# Patient Record
Sex: Female | Born: 1960
Health system: Southern US, Community
[De-identification: ages and names within clinical notes are randomized; demographics above are authoritative.]

## PROBLEM LIST (undated history)

## (undated) DIAGNOSIS — N951 Menopausal and female climacteric states: Secondary | ICD-10-CM

## (undated) HISTORY — PX: TUBAL LIGATION: SHX77

## (undated) HISTORY — PX: ADENOIDECTOMY: SUR15

## (undated) HISTORY — DX: Menopausal and female climacteric states: N95.1

---

## 2001-03-18 ENCOUNTER — Other Ambulatory Visit: Admission: RE | Admit: 2001-03-18 | Discharge: 2001-03-18 | Payer: Self-pay | Admitting: Obstetrics and Gynecology

## 2002-06-03 ENCOUNTER — Other Ambulatory Visit: Admission: RE | Admit: 2002-06-03 | Discharge: 2002-06-03 | Payer: Self-pay | Admitting: Obstetrics and Gynecology

## 2003-06-08 ENCOUNTER — Other Ambulatory Visit: Admission: RE | Admit: 2003-06-08 | Discharge: 2003-06-08 | Payer: Self-pay | Admitting: Obstetrics and Gynecology

## 2004-06-09 ENCOUNTER — Other Ambulatory Visit: Admission: RE | Admit: 2004-06-09 | Discharge: 2004-06-09 | Payer: Self-pay | Admitting: Obstetrics and Gynecology

## 2005-04-24 ENCOUNTER — Ambulatory Visit: Payer: Self-pay | Admitting: Cardiology

## 2005-05-14 ENCOUNTER — Encounter: Payer: Self-pay | Admitting: Family Medicine

## 2005-05-18 ENCOUNTER — Ambulatory Visit: Payer: Self-pay | Admitting: Family Medicine

## 2005-05-31 ENCOUNTER — Ambulatory Visit: Payer: Self-pay | Admitting: Family Medicine

## 2005-06-12 ENCOUNTER — Other Ambulatory Visit: Admission: RE | Admit: 2005-06-12 | Discharge: 2005-06-12 | Payer: Self-pay | Admitting: Obstetrics and Gynecology

## 2006-01-28 ENCOUNTER — Ambulatory Visit: Payer: Self-pay | Admitting: Family Medicine

## 2006-02-28 ENCOUNTER — Ambulatory Visit: Payer: Self-pay | Admitting: Family Medicine

## 2006-04-26 ENCOUNTER — Ambulatory Visit: Payer: Self-pay | Admitting: Family Medicine

## 2006-05-21 DIAGNOSIS — I10 Essential (primary) hypertension: Secondary | ICD-10-CM | POA: Insufficient documentation

## 2006-05-30 ENCOUNTER — Ambulatory Visit: Payer: Self-pay | Admitting: Family Medicine

## 2006-07-19 ENCOUNTER — Encounter: Payer: Self-pay | Admitting: Family Medicine

## 2006-07-24 ENCOUNTER — Ambulatory Visit: Payer: Self-pay | Admitting: Family Medicine

## 2006-07-24 DIAGNOSIS — J329 Chronic sinusitis, unspecified: Secondary | ICD-10-CM | POA: Insufficient documentation

## 2006-07-25 ENCOUNTER — Encounter: Payer: Self-pay | Admitting: Family Medicine

## 2006-07-25 LAB — CONVERTED CEMR LAB
BUN: 6 mg/dL (ref 6–23)
Glucose, Bld: 92 mg/dL (ref 70–99)
Potassium: 5 meq/L (ref 3.5–5.3)

## 2006-07-26 ENCOUNTER — Telehealth (INDEPENDENT_AMBULATORY_CARE_PROVIDER_SITE_OTHER): Payer: Self-pay | Admitting: *Deleted

## 2006-08-15 ENCOUNTER — Telehealth: Payer: Self-pay | Admitting: Family Medicine

## 2006-08-15 ENCOUNTER — Ambulatory Visit: Payer: Self-pay | Admitting: Family Medicine

## 2006-08-20 ENCOUNTER — Ambulatory Visit: Payer: Self-pay | Admitting: Family Medicine

## 2006-08-20 DIAGNOSIS — I7300 Raynaud's syndrome without gangrene: Secondary | ICD-10-CM | POA: Insufficient documentation

## 2006-08-26 ENCOUNTER — Encounter: Payer: Self-pay | Admitting: Family Medicine

## 2006-08-26 ENCOUNTER — Telehealth (INDEPENDENT_AMBULATORY_CARE_PROVIDER_SITE_OTHER): Payer: Self-pay | Admitting: *Deleted

## 2006-09-09 ENCOUNTER — Telehealth: Payer: Self-pay | Admitting: Family Medicine

## 2006-09-16 ENCOUNTER — Telehealth: Payer: Self-pay | Admitting: Family Medicine

## 2006-09-25 ENCOUNTER — Telehealth: Payer: Self-pay | Admitting: Family Medicine

## 2006-10-30 ENCOUNTER — Ambulatory Visit: Payer: Self-pay | Admitting: Family Medicine

## 2007-04-24 ENCOUNTER — Ambulatory Visit: Payer: Self-pay | Admitting: Family Medicine

## 2007-06-04 ENCOUNTER — Encounter: Payer: Self-pay | Admitting: Family Medicine

## 2007-06-04 LAB — CONVERTED CEMR LAB
Alkaline Phosphatase: 32 units/L — ABNORMAL LOW (ref 39–117)
BUN: 8 mg/dL (ref 6–23)
Glucose, Bld: 87 mg/dL (ref 70–99)
HDL: 65 mg/dL (ref >39)
LDL Cholesterol: 77 mg/dL (ref 0–99)
Sodium: 139 meq/L (ref 135–145)
Total Bilirubin: 0.8 mg/dL (ref 0.3–1.2)
Triglycerides: 110 mg/dL (ref <150)
VLDL: 22 mg/dL (ref 0–40)

## 2007-06-05 ENCOUNTER — Encounter: Payer: Self-pay | Admitting: Family Medicine

## 2007-11-03 ENCOUNTER — Ambulatory Visit: Payer: Self-pay | Admitting: Family Medicine

## 2008-05-13 ENCOUNTER — Ambulatory Visit: Payer: Self-pay | Admitting: Family Medicine

## 2008-05-13 DIAGNOSIS — F4321 Adjustment disorder with depressed mood: Secondary | ICD-10-CM | POA: Insufficient documentation

## 2008-07-01 ENCOUNTER — Encounter: Payer: Self-pay | Admitting: Family Medicine

## 2008-07-01 LAB — CONVERTED CEMR LAB
AST: 17 units/L (ref 0–37)
Alkaline Phosphatase: 36 units/L — ABNORMAL LOW (ref 39–117)
BUN: 9 mg/dL (ref 6–23)
Creatinine, Ser: 0.86 mg/dL (ref 0.40–1.20)
HDL: 71 mg/dL (ref >39)
LDL Cholesterol: 79 mg/dL (ref 0–99)
Total CHOL/HDL Ratio: 2.3

## 2008-07-02 ENCOUNTER — Encounter: Payer: Self-pay | Admitting: Family Medicine

## 2008-07-16 ENCOUNTER — Encounter: Admission: RE | Admit: 2008-07-16 | Discharge: 2008-07-16 | Payer: Self-pay | Admitting: Obstetrics and Gynecology

## 2008-11-22 ENCOUNTER — Ambulatory Visit: Payer: Self-pay | Admitting: Family Medicine

## 2008-11-29 ENCOUNTER — Ambulatory Visit: Payer: Self-pay | Admitting: Family Medicine

## 2008-11-29 DIAGNOSIS — R319 Hematuria, unspecified: Secondary | ICD-10-CM | POA: Insufficient documentation

## 2008-11-29 LAB — CONVERTED CEMR LAB
Glucose, Urine, Semiquant: NEGATIVE
Protein, U semiquant: NEGATIVE
WBC Urine, dipstick: NEGATIVE
pH: 7

## 2008-11-30 ENCOUNTER — Encounter: Payer: Self-pay | Admitting: Family Medicine

## 2008-12-14 ENCOUNTER — Encounter: Payer: Self-pay | Admitting: Family Medicine

## 2009-01-28 ENCOUNTER — Encounter: Payer: Self-pay | Admitting: Family Medicine

## 2009-03-04 ENCOUNTER — Encounter: Payer: Self-pay | Admitting: Family Medicine

## 2009-06-02 ENCOUNTER — Ambulatory Visit: Payer: Self-pay | Admitting: Family Medicine

## 2009-07-05 ENCOUNTER — Encounter: Payer: Self-pay | Admitting: Family Medicine

## 2009-07-06 LAB — CONVERTED CEMR LAB
ALT: 16 units/L (ref 0–35)
CO2: 26 meq/L (ref 19–32)
Cholesterol: 179 mg/dL (ref 0–200)
Creatinine, Ser: 0.97 mg/dL (ref 0.40–1.20)
HDL: 84 mg/dL (ref >39)
Total Bilirubin: 0.7 mg/dL (ref 0.3–1.2)
Total CHOL/HDL Ratio: 2.1
VLDL: 18 mg/dL (ref 0–40)

## 2010-07-27 ENCOUNTER — Ambulatory Visit: Payer: Self-pay | Admitting: Family Medicine

## 2010-07-27 ENCOUNTER — Encounter: Payer: Self-pay | Admitting: Family Medicine

## 2010-07-28 LAB — CONVERTED CEMR LAB
ALT: 15 units/L (ref 0–35)
AST: 22 units/L (ref 0–37)
Chloride: 105 meq/L (ref 96–112)
Creatinine, Ser: 0.85 mg/dL (ref 0.40–1.20)
Sodium: 142 meq/L (ref 135–145)
Total Bilirubin: 0.8 mg/dL (ref 0.3–1.2)
Total CHOL/HDL Ratio: 2.4
Total Protein: 7.5 g/dL (ref 6.0–8.3)
VLDL: 16 mg/dL (ref 0–40)

## 2010-09-03 ENCOUNTER — Encounter: Payer: Self-pay | Admitting: Obstetrics and Gynecology

## 2010-09-14 NOTE — Assessment & Plan Note (Signed)
Summary: HTN f/u   Vital Signs:  Patient profile:   50 year old female Height:      65.5 inches Weight:      120 pounds Pulse rate:   106 / minute BP sitting:   125 / 80  (right arm) Cuff size:   regular  Vitals Entered By: Avon Gully CMA, Duncan Dull) (July 27, 2010 8:07 AM) CC: F/u BP   Primary Care Provider:  Seymour Bars DO  CC:  F/u BP.  History of Present Illness: 50 yo WF presents for f/u HTN.  Doing well on Exforge.  She is due for fasting labs. Denies CP, palpitations, SOB.  Eats well and exercises reguarly.  Saw gyn 6 mos ago for her pap smear and mammogram.  Current Medications (verified): 1)  Oscal 500/200 D-3 500-200 Mg-Unit Tabs (Calcium-Vitamin D) 2)  Centrum  Tabs (Multiple Vitamins-Minerals) .... Take 1 Tablet By Mouth Once A Day 3)  Flonase 50 Mcg/act Susp (Fluticasone Propionate) .... 2 Sprays Per Nostril Daily 4)  Exforge 10-160 Mg  Tabs (Amlodipine Besylate-Valsartan) .Marland Kitchen.. 1 Tab By Mouth Daily 5)  Enzyme Digest   Caps (Digestive Enzymes) .... Take 1 Tablet By Mouth Once A Day As Needed 6)  Aviane 0.1-20 Mg-Mcg Tabs (Levonorgestrel-Ethinyl Estrad) 7)  Cinnamon 100 Mg Caps (Cinnamon) .... Take One Tablet Twice A Day 8)  Pumpkin Seed Oil  Caps (Misc Natural Products)  Allergies (verified): 1)  ! Pcn 2)  ! Sulfa  Comments:  Nurse/Medical Assistant: The patient's medications and allergies were reviewed with the patient and were updated in the Medication and Allergy Lists. Avon Gully CMA, Duncan Dull) (July 27, 2010 8:10 AM)  Past History:  Past Medical History: Reviewed history from 06/02/2009 and no changes required. atypical CP- eval by Dr Antoine Poche 9-06  G0   perimenopausal  seeing a counselor  gyn : Dr Dareen Piano, pap and mammo normal 11-09  Past Surgical History: Reviewed history from 07/24/2006 and no changes required. adenoidectomy  BTL  EKG NSR, Right axis deviation, RSR prime V1  MRI brain- ?small vsl dz vs MS  Social  History: Reviewed history from 11/29/2008 and no changes required. Works as Corporate treasurer. Asst.  Widowed  to Bill (2009) from malignant melanoma.    no children.  Nonsmoker.  Exercises regularly.    Review of Systems      See HPI  Physical Exam  General:  alert, well-developed, well-nourished, and well-hydrated.   Eyes:  pupils equal, pupils round, and pupils reactive to light.   Mouth:  good dentition and pharynx pink and moist.   Neck:  no masses.   Lungs:  Normal respiratory effort, chest expands symmetrically. Lungs are clear to auscultation, no crackles or wheezes. Heart:  Normal rate and regular rhythm. S1 and S2 normal without gallop, murmur, click, rub or other extra sounds. Pulses:  2+ radial pulses Extremities:  no LE edema Skin:  color normal.   Psych:  good eye contact, not anxious appearing, and not depressed appearing.     Impression & Recommendations:  Problem # 1:  HYPERTENSION, BENIGN SYSTEMIC (ICD-401.1) Doing well.  RFd Exforge and update labs today.  RTC in 1 yr for f/u.   Her updated medication list for this problem includes:    Exforge 10-160 Mg Tabs (Amlodipine besylate-valsartan) .Marland Kitchen... 1 tab by mouth daily  BP today: 125/80 Prior BP: 113/73 (06/02/2009)  Prior 10 Yr Risk Heart Disease: 2 % (11/03/2007)  Labs Reviewed: K+: 4.3 (07/05/2009) Creat: : 0.97 (07/05/2009)  Chol: 179 (07/05/2009)   HDL: 84 (07/05/2009)   LDL: 77 (07/05/2009)   TG: 90 (07/05/2009)  Complete Medication List: 1)  Oscal 500/200 D-3 500-200 Mg-unit Tabs (Calcium-vitamin d) 2)  Centrum Tabs (Multiple vitamins-minerals) .... Take 1 tablet by mouth once a day 3)  Flonase 50 Mcg/act Susp (Fluticasone propionate) .... 2 sprays per nostril daily 4)  Exforge 10-160 Mg Tabs (Amlodipine besylate-valsartan) .Marland Kitchen.. 1 tab by mouth daily 5)  Enzyme Digest Caps (Digestive enzymes) .... Take 1 tablet by mouth once a day as needed 6)  Aviane 0.1-20 Mg-mcg Tabs (Levonorgestrel-ethinyl estrad) 7)   Cinnamon 100 Mg Caps (cinnamon)  .... Take one tablet twice a day 8)  Pumpkin Seed Oil Caps (Misc natural products)  Other Orders: T-Comprehensive Metabolic Panel [id number]) T-Lipid Profile ([id number])  Patient Instructions: 1)  Update fasting labs. 2)  Will call you w/ results tomorrow. 3)  BP looks great! 4)  Exforge RFd x 1 yr. 5)  Keep up the good work and return in 1 yr. 6)  Have a [employer] Christmas! Prescriptions: EXFORGE 10-160 MG  TABS (AMLODIPINE BESYLATE-VALSARTAN) 1 tab by mouth daily  #90 x 3   Entered and Authorized by:   Seymour Bars DO   Signed by:   Seymour Bars DO on 07/27/2010   Method used:   Faxed to ...       [facility] (mail-order)             , [place]         Ph:        Fax: [phone]   RxID:   [id number]    Orders Added: 1)  T-Comprehensive Metabolic Panel [[id number]] 2)  T-Lipid Profile [[id number]] 3)  Est. Patient Level III [44010]

## 2011-07-26 ENCOUNTER — Encounter: Payer: Self-pay | Admitting: Family Medicine

## 2011-07-31 ENCOUNTER — Other Ambulatory Visit: Payer: Self-pay | Admitting: *Deleted

## 2011-07-31 ENCOUNTER — Encounter: Payer: Self-pay | Admitting: Family Medicine

## 2011-07-31 MED ORDER — AMLODIPINE BESYLATE-VALSARTAN 10-160 MG PO TABS: 1.0000 | ORAL_TABLET | Freq: Every day | ORAL | Status: DC

## 2011-09-04 ENCOUNTER — Encounter: Payer: Self-pay | Admitting: Family Medicine

## 2011-09-12 ENCOUNTER — Encounter: Payer: Self-pay | Admitting: Family Medicine

## 2011-09-12 ENCOUNTER — Ambulatory Visit (INDEPENDENT_AMBULATORY_CARE_PROVIDER_SITE_OTHER): Payer: BC Managed Care – PPO | Admitting: Family Medicine

## 2011-09-12 VITALS — BP 96/62 | HR 101 | Wt 119.0 lb

## 2011-09-12 DIAGNOSIS — Z0000 Encounter for general adult medical examination without abnormal findings: Secondary | ICD-10-CM

## 2011-09-12 DIAGNOSIS — H538 Other visual disturbances: Secondary | ICD-10-CM

## 2011-09-12 LAB — LIPID PANEL
LDL Cholesterol: 93 mg/dL (ref 0–99)
Total CHOL/HDL Ratio: 2.6 Ratio
VLDL: 18 mg/dL (ref 0–40)

## 2011-09-12 LAB — COMPLETE METABOLIC PANEL WITH GFR
ALT: 14 U/L (ref 0–35)
AST: 20 U/L (ref 0–37)
Albumin: 4.2 g/dL (ref 3.5–5.2)
Alkaline Phosphatase: 42 U/L (ref 39–117)
Chloride: 105 mEq/L (ref 96–112)
Potassium: 4.7 mEq/L (ref 3.5–5.3)
Sodium: 139 mEq/L (ref 135–145)
Total Protein: 7 g/dL (ref 6.0–8.3)

## 2011-09-12 LAB — CBC
MCV: 96.1 fL (ref 78.0–100.0)
Platelets: 264 10*3/uL (ref 150–400)
RDW: 11.9 % (ref 11.5–15.5)
WBC: 8.3 10*3/uL (ref 4.0–10.5)

## 2011-09-12 MED ORDER — AMLODIPINE BESYLATE-VALSARTAN 5-160 MG PO TABS: 1.0000 | ORAL_TABLET | Freq: Every day | ORAL | Status: DC

## 2011-09-12 NOTE — Progress Notes (Signed)
Subjective:     Patricia Barajas is a 51 y.o. female and is here for a comprehensive physical exam. The patient reports no problems. occ blurry vision, randomly. No aggrevating or alleviating sxs.  Maybe 5 -10 seconds. Had normal eye exam about 3 months ago.  No known triggers. No headaches or loss of peripheral vision with the episodes.  History   Social History  . Marital Status: Married    Spouse Name: N/A    Number of Children: N/A  . Years of Education: N/A   Occupational History  . Not on file.   Social History Main Topics  . Smoking status: Never Smoker   . Smokeless tobacco: Not on file  . Alcohol Use: No  . Drug Use: No  . Sexually Active:    Other Topics Concern  . Not on file   Social History Narrative  . No narrative on file   Health Maintenance  Topic Date Due  . Mammogram  03/20/2011  . Colonoscopy  03/20/2011  . Influenza Vaccine  05/13/2012  . Pap Smear  12/11/2013  . Tetanus/tdap  04/23/2017    The following portions of the patient's history were reviewed and updated as appropriate: allergies, current medications, past family history, past medical history, past social history, past surgical history and problem list.  Review of Systems A comprehensive review of systems was negative.   Objective:   BP 96/62  Pulse 101  Wt 119 lb (53.978 kg)  General Appearance:    Alert, cooperative, no distress, appears stated age  Head:    Normocephalic, without obvious abnormality, atraumatic  Eyes:    PERRL, conjunctiva/corneas clear, EOM's intact, both eyes  Ears:    Normal TM's and external ear canals, both ears  Nose:   Nares normal, septum midline, mucosa normal, no drainage    or sinus tenderness  Throat:   Lips, mucosa, and tongue normal; teeth and gums normal  Neck:   Supple, symmetrical, trachea midline, no adenopathy;    thyroid:  no enlargement/tenderness/nodules; no carotid   bruit or JVD  Back:     Symmetric, no curvature, ROM normal, no CVA  tenderness  Lungs:     Clear to auscultation bilaterally, respirations unlabored  Chest Wall:    No tenderness or deformity   Heart:    Regular rate and rhythm, S1 and S2 normal, no murmur, rub   or gallop  Breast Exam:    No tenderness, masses, or nipple abnormality  Abdomen:     Soft, non-tender, bowel sounds active all four quadrants,    no masses, no organomegaly  Genitalia:    N/A  Rectal:    N/A  Extremities:   Extremities normal, atraumatic, no cyanosis or edema  Pulses:   2+ and symmetric all extremities  Skin:   Skin color, texture, turgor normal, no rashes or lesions  Lymph nodes:   Cervical, supraclavicular, and axillary nodes normal  Neurologic:   CNII-XII intact, normal reflexes    Throughout, gross neuro intact.       Assessment:    Healthy female exam.      Plan:    Start a regular exercise program and make sure you are eating a healthy diet Try to eat 4 servings of dairy a day or take a calcium supplement (500mg  twice a day). Your vaccines are up to date.  Screening labs.    Blurry vision - Will check CBC to rule out anemia. Has had normal eye exam. Maybe  from low bp. Will dec her HTN med dose. F/U in 2 months to see if she has any more episodes of blurry vision or if it resolves after we decreased her blood pressure medication..  See After Visit Summary for Counseling Recommendations   Discuss colon cancer screening since she is now 50. She would like to have it done but she says she'll have to work on having a driver since her husband passed away and she does not have any children are nearby relatives. She will call me when she is ready to have it scheduled.

## 2011-09-12 NOTE — Patient Instructions (Addendum)
Start a regular exercise program and make sure you are eating a healthy diet Try to eat 4 servings of dairy a day or take a calcium supplement (500mg  twice a day). Your vaccines are up to date.  Call me when you are ready for referral for you colonoscopy.

## 2011-09-13 LAB — VITAMIN D 25 HYDROXY (VIT D DEFICIENCY, FRACTURES): Vit D, 25-Hydroxy: 39 ng/mL (ref 30–89)

## 2011-09-13 NOTE — Progress Notes (Signed)
Quick Note:  All labs are normal. ______ 

## 2011-09-20 ENCOUNTER — Telehealth: Payer: Self-pay | Admitting: Family Medicine

## 2011-09-20 DIAGNOSIS — Z1211 Encounter for screening for malignant neoplasm of colon: Secondary | ICD-10-CM

## 2011-09-20 NOTE — Telephone Encounter (Signed)
Patient called and states that she is waiting to get her colonoscopy scheduled but I do not see a referral for her. If you put it in, I will call and get it scheduled. Thanks, [employer]

## 2011-09-21 NOTE — Telephone Encounter (Signed)
Since she saw Dr Linford Arnold end of January  for a complete  PE and discussed this issue with her according to the notes I read and I have never ever saw her could you check w/ Dr Linford Arnold? Thank you.

## 2011-09-29 NOTE — Telephone Encounter (Signed)
I placed the order. Tell her sorry for the delay.

## 2011-11-15 ENCOUNTER — Ambulatory Visit: Payer: BC Managed Care – PPO | Admitting: Family Medicine

## 2011-11-19 ENCOUNTER — Ambulatory Visit: Payer: BC Managed Care – PPO | Admitting: Family Medicine

## 2011-11-26 ENCOUNTER — Encounter: Payer: Self-pay | Admitting: *Deleted

## 2011-11-27 ENCOUNTER — Encounter: Payer: Self-pay | Admitting: Family Medicine

## 2011-11-27 ENCOUNTER — Ambulatory Visit (INDEPENDENT_AMBULATORY_CARE_PROVIDER_SITE_OTHER): Payer: BC Managed Care – PPO | Admitting: Family Medicine

## 2011-11-27 VITALS — BP 111/71 | HR 79 | Ht 65.5 in | Wt 121.0 lb

## 2011-11-27 DIAGNOSIS — I10 Essential (primary) hypertension: Secondary | ICD-10-CM

## 2011-11-27 DIAGNOSIS — Z23 Encounter for immunization: Secondary | ICD-10-CM

## 2011-11-27 MED ORDER — VALSARTAN 160 MG PO TABS: 160.0000 mg | ORAL_TABLET | Freq: Every day | ORAL | Status: DC

## 2011-11-27 NOTE — Progress Notes (Signed)
  Subjective:    Patient ID: Patricia Barajas, female    DOB: October 03, 1960, 51 y.o.   MRN: [id number]  HPI Followup blurry vision-for her at her last office visit her blood pressure was very low and we felt it was secondary to that. Went to eye doc and told was dry eye.  She has been using drops since then she has noticed a major improvement in her eye symptoms.  HTN - Dong well on reduced BP medication. Because pain or shortness of breath. She is taking her medication consistently. She has tried multiple blood pressure agents and this is the one that works the best.   Review of Systems     Objective:   Physical Exam  Constitutional: She is oriented to person, place, and time. She appears well-developed and well-nourished.  HENT:  Head: Normocephalic and atraumatic.  Cardiovascular: Normal rate, regular rhythm and normal heart sounds.   Pulmonary/Chest: Effort normal and breath sounds normal.  Neurological: She is alert and oriented to person, place, and time.  Skin: Skin is warm and dry.  Psychiatric: She has a normal mood and affect. Her behavior is normal.          Assessment & Plan:  HTN- will change to diovan 160mg  daily. We will discontinue the amlodipine portion of her medication. I gave her a coupon for today. We will send her for a 30 day supply to [facility]. She can followup in 2-3 weeks for nurse blood pressure check. If her blood pressures at all times and a 90 day supply with one refill to her mail order pharmacy. Then she will followup in 6 months with me. Her blood work is up-to-date she had in January. A copy given today.  Shingles vaccine given.  She had her colonoscopy last week. She still awaiting the pathology results of a polyp that was removed.  She has her pap and mammogram schedule for June.

## 2011-12-03 ENCOUNTER — Encounter: Payer: Self-pay | Admitting: *Deleted

## 2011-12-14 ENCOUNTER — Ambulatory Visit (INDEPENDENT_AMBULATORY_CARE_PROVIDER_SITE_OTHER): Payer: BC Managed Care – PPO | Admitting: Family Medicine

## 2011-12-14 VITALS — BP 118/60 | HR 85

## 2011-12-14 DIAGNOSIS — I10 Essential (primary) hypertension: Secondary | ICD-10-CM

## 2011-12-14 MED ORDER — VALSARTAN 160 MG PO TABS: 160.0000 mg | ORAL_TABLET | Freq: Every day | ORAL | Status: DC

## 2011-12-14 NOTE — Progress Notes (Signed)
  Subjective:    Patient ID: Patricia Barajas, female    DOB: Aug 24, 1960, 51 y.o.   MRN: [id number] BP check after changing medication. Instructed pt to continue med and follow-up in 6 months as planned. Sent 90 day supply of Diovan to mail order [facility]    Review of Systems     Objective:   Physical Exam        Assessment & Plan:  HTN- looks fantastic today. F/U in 6 months C. Linford Arnold, MD

## 2012-03-26 ENCOUNTER — Telehealth: Payer: Self-pay | Admitting: *Deleted

## 2012-03-26 NOTE — Telephone Encounter (Signed)
Pt called stating she had been to give blood and that her BP was 140/78. They informed her that she needed to let her PCP know. Pt states she is having some anxiety issues and is leaving for a weeks vacation. Informed pt to keep a check on her BP and if it didn't stay down then she needed to schedule a f/u appt. States she is in menopause and that when she schedules an appt she would like to schedule for extended time due to needing to have a discussion with Dr. Linford Arnold. Pt agrees with the plan.

## 2012-04-08 ENCOUNTER — Encounter: Payer: Self-pay | Admitting: Family Medicine

## 2012-04-08 ENCOUNTER — Ambulatory Visit (INDEPENDENT_AMBULATORY_CARE_PROVIDER_SITE_OTHER): Payer: BC Managed Care – PPO

## 2012-04-08 ENCOUNTER — Ambulatory Visit (INDEPENDENT_AMBULATORY_CARE_PROVIDER_SITE_OTHER): Payer: BC Managed Care – PPO | Admitting: Family Medicine

## 2012-04-08 VITALS — BP 114/76 | HR 66 | Ht 65.5 in | Wt 114.0 lb

## 2012-04-08 DIAGNOSIS — N959 Unspecified menopausal and perimenopausal disorder: Secondary | ICD-10-CM | POA: Insufficient documentation

## 2012-04-08 DIAGNOSIS — R0789 Other chest pain: Secondary | ICD-10-CM

## 2012-04-08 DIAGNOSIS — I10 Essential (primary) hypertension: Secondary | ICD-10-CM

## 2012-04-08 DIAGNOSIS — R079 Chest pain, unspecified: Secondary | ICD-10-CM

## 2012-04-08 DIAGNOSIS — R634 Abnormal weight loss: Secondary | ICD-10-CM

## 2012-04-08 LAB — CBC WITH DIFFERENTIAL/PLATELET
Eosinophils Absolute: 0.1 10*3/uL (ref 0.0–0.7)
Eosinophils Relative: 1 % (ref 0–5)
HCT: 37.2 % (ref 36.0–46.0)
Hemoglobin: 12.7 g/dL (ref 12.0–15.0)
Lymphs Abs: 1.6 10*3/uL (ref 0.7–4.0)
MCH: 32.2 pg (ref 26.0–34.0)
MCV: 94.4 fL (ref 78.0–100.0)
Monocytes Relative: 10 % (ref 3–12)
RBC: 3.94 MIL/uL (ref 3.87–5.11)

## 2012-04-08 LAB — COMPLETE METABOLIC PANEL WITH GFR
CO2: 28 mEq/L (ref 19–32)
Creat: 0.84 mg/dL (ref 0.50–1.10)
GFR, Est African American: >89 mL/min
GFR, Est Non African American: 81 mL/min
Glucose, Bld: 100 mg/dL — ABNORMAL HIGH (ref 70–99)
Sodium: 142 mEq/L (ref 135–145)
Total Bilirubin: 1 mg/dL (ref 0.3–1.2)
Total Protein: 7.2 g/dL (ref 6.0–8.3)

## 2012-04-08 NOTE — Patient Instructions (Addendum)
We will call you with your lab results. If you don't here from us in about a week then please give us a call at [phone].  

## 2012-04-08 NOTE — Progress Notes (Signed)
Subjective:    Patient ID: Patricia Barajas, female    DOB: 08/07/1961, 51 y.o.   MRN: [id number]  HPI Abnormal weight loss - Came off OCP in June.  Has bee more emotional since then.  Occ episodes of palpoations. No diaphoresis or chest pain during these episodes. She feels like it's most likely related to coming off of her oral contraceptive. She's noticed it more for the last couple months. This is also the 4 year aniversary of her husbands death.  No SOB or chest pain.  Pap smear is uptodate.  Last colonoscopy was in April and was normal. No change in appetiteie. No GERD or dysphagia. No fever or cough.  Never smoked. Normal BMS.  She did have a normal thyroid level in June with her GYN.  She is also concerned about her blood pressure. She's been following in the home. When she went to give blood a few weeks ago her blood pressure was 140/72. She had 2 readings at home are elevated, 158/92, 139/94.  She does describe a pressure sensation over the bottom of her sternum near the xiphoid process. She says she has had that before when she gets anxious or stressed out but eventually goes away. She has been experiencing not recently as well. She felt it was probably anxiety and stress induced.  Review of Systems  BP 114/76  Pulse 66  Ht 5' 5.5" (1.664 m)  Wt 114 lb (51.71 kg)  BMI 18.68 kg/m2    Allergies  Allergen Reactions  . Penicillins   . Sulfonamide Derivatives     Past Medical History  Diagnosis Date  . Perimenopausal     Past Surgical History  Procedure Date  . Adenoidectomy   . Tubal ligation     History   Social History  . Marital Status: Widowed    Spouse Name: N/A    Number of Children: N/A  . Years of Education: N/A   Occupational History  . Not on file.   Social History Main Topics  . Smoking status: Never Smoker   . Smokeless tobacco: Not on file  . Alcohol Use: No  . Drug Use: No  . Sexually Active:    Other Topics Concern  . Not on file   Social  History Narrative   She is widowed.     Family History  Problem Relation Age of Onset  . Hypertension Mother   . Stroke Father   . Cancer Maternal Grandfather     brain    Outpatient Encounter Prescriptions as of 04/08/2012  Medication Sig Dispense Refill  . valsartan (DIOVAN) 160 MG tablet Take 1 tablet (160 mg total) by mouth daily.  90 tablet  1  . DISCONTD: levonorgestrel-ethinyl estradiol (AVIANE) 0.1-20 MG-MCG tablet Take 1 tablet by mouth daily.              Objective:   Physical Exam  Constitutional: She is oriented to person, place, and time. She appears well-developed and well-nourished.  HENT:  Head: Normocephalic and atraumatic.  Eyes: Conjunctivae are normal. Pupils are equal, round, and reactive to light.  Neck: Neck supple. No thyromegaly present.  Cardiovascular: Normal rate, regular rhythm and normal heart sounds.   Pulmonary/Chest: Effort normal and breath sounds normal. She exhibits no tenderness.       Palpation of the sternum and xiphoid process is normal.   Abdominal: Soft. Bowel sounds are normal. She exhibits no distension and no mass. There is no tenderness. There is no  rebound and no guarding.  Lymphadenopathy:    She has no cervical adenopathy.  Neurological: She is alert and oriented to person, place, and time.  Skin: Skin is warm and dry.  Psychiatric: She has a normal mood and affect. Her behavior is normal.          Assessment & Plan:  HTN  - blood pressure is well-controlled. Continue current regimen. She's had a couple of high readings at home. I encouraged her to try to check her machine either against ours with a nurse visit or going to local pharmacy.  Weight Loss - unclear etiology. She has really no other symptoms besides the actual abnormal weight loss. It does sound like she has changed her diet recently she denies feeling that these changes are significant. I did encourage her to try maybe keep track of her calories in the next  week to see if she's getting adequate caloric intake. Or she could work which is increasing her food intake over all to see if she's able to gain a pound or 2 in the next couple weeks. I would like to get a chest x-ray though she has never been a smoker. Her mammogram, colonoscopy, Pap smear are all up to date have all been performed within the last 12 months. I would like to recheck her thyroid to make sure that she's not developing hyperthyroidism. Also check CBC to rule out any type of blood count abnormalities.  Sternal pressure - we will get an x-ray to rule out any significant abnormalities. On palpation exam today it appears normal.

## 2012-04-17 ENCOUNTER — Ambulatory Visit (INDEPENDENT_AMBULATORY_CARE_PROVIDER_SITE_OTHER): Payer: BC Managed Care – PPO | Admitting: Family Medicine

## 2012-04-17 VITALS — BP 113/69

## 2012-04-17 DIAGNOSIS — I10 Essential (primary) hypertension: Secondary | ICD-10-CM

## 2012-04-17 NOTE — Progress Notes (Signed)
  Subjective:    Patient ID: Patricia Barajas, female    DOB: [birth year], 51 y.o.   MRN: [id number]  HPI  She is here for blood pressure check today. She brought in her home she is well to see if it is accurate. Her blood pressure machine was off. She plans on contacting the company to see if it can be recalibrated.  Review of Systems     Objective:   Physical Exam        Assessment & Plan:  HTN- well-controlled. Continue current regimen. Keep original followup.

## 2012-05-05 ENCOUNTER — Telehealth: Payer: Self-pay | Admitting: *Deleted

## 2012-05-05 DIAGNOSIS — E875 Hyperkalemia: Secondary | ICD-10-CM

## 2012-05-07 LAB — CALCIUM: Calcium: 9.4 mg/dL (ref 8.4–10.5)

## 2012-05-23 ENCOUNTER — Other Ambulatory Visit: Payer: Self-pay | Admitting: *Deleted

## 2012-05-23 MED ORDER — VALSARTAN 160 MG PO TABS: 160.0000 mg | ORAL_TABLET | Freq: Every day | ORAL | Status: DC

## 2012-10-29 ENCOUNTER — Telehealth: Payer: Self-pay | Admitting: *Deleted

## 2012-10-29 DIAGNOSIS — Z0000 Encounter for general adult medical examination without abnormal findings: Secondary | ICD-10-CM

## 2012-10-29 DIAGNOSIS — I7300 Raynaud's syndrome without gangrene: Secondary | ICD-10-CM

## 2012-10-29 NOTE — Telephone Encounter (Signed)
CMP, fasting lipid panel, CBC. Can use V70.0 as well as her Raynaud's diagnosis.

## 2012-10-29 NOTE — Telephone Encounter (Signed)
Patient has appointment on 4/3 for CPE and would like to get her labs done before her appt that morning. Please let me know what labs and diagnosisi and I will enter and fax to [facility]

## 2012-10-30 NOTE — Telephone Encounter (Signed)
Orders faxed to [facility]. Barry Dienes, LPN

## 2012-11-13 ENCOUNTER — Encounter: Payer: Self-pay | Admitting: Family Medicine

## 2012-11-13 ENCOUNTER — Ambulatory Visit (INDEPENDENT_AMBULATORY_CARE_PROVIDER_SITE_OTHER): Payer: BC Managed Care – PPO | Admitting: Family Medicine

## 2012-11-13 VITALS — BP 120/74 | HR 83 | Ht 66.0 in | Wt 115.0 lb

## 2012-11-13 DIAGNOSIS — I10 Essential (primary) hypertension: Secondary | ICD-10-CM

## 2012-11-13 DIAGNOSIS — Z0000 Encounter for general adult medical examination without abnormal findings: Secondary | ICD-10-CM

## 2012-11-13 LAB — CBC WITH DIFFERENTIAL/PLATELET
Basophils Relative: 1 % (ref 0–1)
Eosinophils Absolute: 0.1 10*3/uL (ref 0.0–0.7)
MCH: 31.5 pg (ref 26.0–34.0)
MCHC: 34.7 g/dL (ref 30.0–36.0)
Monocytes Relative: 9 % (ref 3–12)
Neutrophils Relative %: 63 % (ref 43–77)
Platelets: 236 10*3/uL (ref 150–400)
RDW: 12.8 % (ref 11.5–15.5)

## 2012-11-13 LAB — LIPID PANEL
HDL: 77 mg/dL (ref >39)
LDL Cholesterol: 97 mg/dL (ref 0–99)
Total CHOL/HDL Ratio: 2.5 Ratio
Triglycerides: 89 mg/dL (ref <150)
VLDL: 18 mg/dL (ref 0–40)

## 2012-11-13 LAB — COMPLETE METABOLIC PANEL WITH GFR
ALT: 19 U/L (ref 0–35)
AST: 23 U/L (ref 0–37)
Chloride: 103 mEq/L (ref 96–112)
Creat: 0.76 mg/dL (ref 0.50–1.10)
Total Bilirubin: 1 mg/dL (ref 0.3–1.2)

## 2012-11-13 MED ORDER — VALSARTAN 160 MG PO TABS: 160.0000 mg | ORAL_TABLET | Freq: Every day | ORAL | Status: DC

## 2012-11-13 NOTE — Progress Notes (Signed)
Subjective:     Patricia Barajas is a 52 y.o. female and is here for a comprehensive physical exam. The patient reports no problems.  She recently saw her dermatologist. She has a mammogram and Pap smear planned for May. She does follow with an OB/GYN. Her colonoscopy is up-to-date. Her vaccines are up to date.  She has noticed some abnormal weight loss. She has no cervical or more this. She's not intentionally trying to lose weight. She admits she's been trying to eat more healthy overall. She typically weighs between 119 122 for about the last 5 years until recently. She denies any changes in her bowels. No blood in the stool. No pencil thin stools. Her screening cancer tests are up to date. She feels like her energy levels are good. She has been experiencing some recent hot flashes and mood change due to menopause.  History   Social History  . Marital Status: Widowed    Spouse Name: N/A    Number of Children: N/A  . Years of Education: N/A   Occupational History  . Not on file.   Social History Main Topics  . Smoking status: Never Smoker   . Smokeless tobacco: Not on file  . Alcohol Use: No  . Drug Use: No  . Sexually Active:    Other Topics Concern  . Not on file   Social History Narrative   She is widowed.    Health Maintenance  Topic Date Due  . Influenza Vaccine  04/13/2012  . Mammogram  12/11/2013  . Pap Smear  12/12/2014  . Tetanus/tdap  04/23/2017  . Colonoscopy  11/22/2021    The following portions of the patient's history were reviewed and updated as appropriate: allergies, current medications, past family history, past medical history, past social history, past surgical history and problem list.  Review of Systems A comprehensive review of systems was negative.   Objective:    BP 120/74  Pulse 83  Ht 5\' 6"  (1.676 m)  Wt 115 lb (52.164 kg)  BMI 18.57 kg/m2 General appearance: alert, oriented Head: Normocephalic, without obvious abnormality,  atraumatic Eyes: conj clear, EOMI, PEERLA Ears: normal TM's and external ear canals both ears Nose: Nares normal. Septum midline. Mucosa normal. No drainage or sinus tenderness. Throat: lips, mucosa, and tongue normal; teeth and gums normal Neck: no adenopathy, no carotid bruit, no JVD, supple, symmetrical, trachea midline and thyroid not enlarged, symmetric, no tenderness/mass/nodules Back: symmetric, no curvature. ROM normal. No CVA tenderness. Lungs: clear to auscultation bilaterally Breasts: normal appearance, no masses or tenderness, not examined, she has gyn Heart: regular rate and rhythm, S1, S2 normal, no murmur, click, rub or gallop Abdomen: soft, non-tender; bowel sounds normal; no masses,  no organomegaly Extremities: extremities normal, atraumatic, no cyanosis or edema Pulses: 2+ and symmetric Skin: Skin color, texture, turgor normal. No rashes or lesions Lymph nodes: Cervical adenopathy: normal and Axillary adenopathy: normal Neurologic: Alert and oriented X 3, normal strength and tone. Normal symmetric reflexes. Normal coordination and gait    Assessment:    Healthy female exam.      Plan:     See After Visit Summary for Counseling Recommendations  Keep up a regular exercise program and make sure you are eating a healthy diet Try to eat 4 servings of dairy a day, or if you are lactose intolerant take a calcium with vitamin D daily.  Your vaccines are up to date.  Check CMP and lipids.   mammo UTD.. Due in  May Pap smear UTD Colonoscopy UTD.   Abnormal weight loss-discussed that we will recheck her TSH today. We'll also check a CBC. She's up-to-date on her mammogram, Pap smear, colonoscopy. She has no other systemic symptoms.

## 2012-11-13 NOTE — Patient Instructions (Addendum)
Keep up a regular exercise program and make sure you are eating a healthy diet Try to eat 4 servings of dairy a day, or if you are lactose intolerant take a calcium with vitamin D daily.  Your vaccines are up to date.   

## 2012-11-13 NOTE — Progress Notes (Signed)
Quick Note:  All labs are normal. ______ 

## 2013-03-24 LAB — HEPATIC FUNCTION PANEL: AST: 22 U/L (ref 13–35)

## 2013-03-24 LAB — TSH: TSH: 1.36 u[IU]/mL (ref 0.41–5.90)

## 2013-03-24 LAB — CBC AND DIFFERENTIAL: Hemoglobin: 11.2 g/dL — AB (ref 12.0–16.0)

## 2013-03-24 LAB — BASIC METABOLIC PANEL
Glucose: 90 mg/dL
Potassium: 5.6 mmol/L — AB (ref 3.4–5.3)
Sodium: 112 mmol/L — AB (ref 137–147)

## 2013-03-24 LAB — LIPID PANEL: HDL: 100 mg/dL — AB (ref 35–70)

## 2013-04-09 ENCOUNTER — Encounter: Payer: Self-pay | Admitting: *Deleted

## 2013-06-18 ENCOUNTER — Other Ambulatory Visit: Payer: Self-pay

## 2013-11-11 ENCOUNTER — Encounter: Payer: Self-pay | Admitting: Family Medicine

## 2013-11-11 ENCOUNTER — Ambulatory Visit (INDEPENDENT_AMBULATORY_CARE_PROVIDER_SITE_OTHER): Payer: BC Managed Care – PPO | Admitting: Family Medicine

## 2013-11-11 VITALS — BP 134/79 | HR 91 | Ht 66.0 in | Wt 114.0 lb

## 2013-11-11 DIAGNOSIS — Z0000 Encounter for general adult medical examination without abnormal findings: Secondary | ICD-10-CM

## 2013-11-11 DIAGNOSIS — I10 Essential (primary) hypertension: Secondary | ICD-10-CM

## 2013-11-11 DIAGNOSIS — R0789 Other chest pain: Secondary | ICD-10-CM

## 2013-11-11 LAB — LIPID PANEL
CHOL/HDL RATIO: 2 ratio
Cholesterol: 180 mg/dL (ref 0–200)
HDL: 90 mg/dL (ref >39)
LDL Cholesterol: 77 mg/dL (ref 0–99)
TRIGLYCERIDES: 65 mg/dL (ref <150)
VLDL: 13 mg/dL (ref 0–40)

## 2013-11-11 LAB — COMPLETE METABOLIC PANEL WITH GFR
ALK PHOS: 58 U/L (ref 39–117)
ALT: 17 U/L (ref 0–35)
AST: 22 U/L (ref 0–37)
Albumin: 4.5 g/dL (ref 3.5–5.2)
BILIRUBIN TOTAL: 1.2 mg/dL (ref 0.2–1.2)
BUN: 7 mg/dL (ref 6–23)
CHLORIDE: 104 meq/L (ref 96–112)
CO2: 30 mEq/L (ref 19–32)
CREATININE: 0.75 mg/dL (ref 0.50–1.10)
Calcium: 9.3 mg/dL (ref 8.4–10.5)
GLUCOSE: 92 mg/dL (ref 70–99)
POTASSIUM: 3.8 meq/L (ref 3.5–5.3)
SODIUM: 141 meq/L (ref 135–145)
Total Protein: 7.1 g/dL (ref 6.0–8.3)

## 2013-11-11 LAB — CBC
HEMATOCRIT: 40 % (ref 36.0–46.0)
Hemoglobin: 14 g/dL (ref 12.0–15.0)
MCH: 32 pg (ref 26.0–34.0)
MCHC: 35 g/dL (ref 30.0–36.0)
MCV: 91.3 fL (ref 78.0–100.0)
Platelets: 257 10*3/uL (ref 150–400)
RBC: 4.38 MIL/uL (ref 3.87–5.11)
RDW: 12.8 % (ref 11.5–15.5)
WBC: 5 10*3/uL (ref 4.0–10.5)

## 2013-11-11 MED ORDER — AMLODIPINE BESYLATE-VALSARTAN 5-160 MG PO TABS: 1.0000 | ORAL_TABLET | Freq: Every day | ORAL | Status: DC

## 2013-11-11 NOTE — Patient Instructions (Signed)
Keep up a regular exercise program and make sure you are eating a healthy diet Try to eat 4 servings of dairy a day, or if you are lactose intolerant take a calcium with vitamin D daily.  Your vaccines are up to date.   

## 2013-11-11 NOTE — Addendum Note (Signed)
Addended by: Nani GasserMETHENEY, CATHERINE D on: 11/11/2013 09:04 AM   Modules accepted: Level of Service

## 2013-11-11 NOTE — Progress Notes (Addendum)
Subjective:     Patricia Barajas is a 53 y.o. female and is here for a comprehensive physical exam. The patient reports problems - traveling to [place] soon and want to know how to take BP meds.  Hypertension- Pt denies chest pain, SOB, dizziness, or heart palpitations.  Taking meds as directed w/o problems.  Denies medication side effects.  She would like to consider going back on for a period when she was on the combination actually helped her rate notes more. He does have amlodipine. She's currently just taking valsartan 160 mg daily.  Also getting some epigastric pain and into the left upper chest and left side of neck.  Usually happens in the AM. Never happens with exercise.     History   Social History  . Marital Status: Widowed    Spouse Name: N/A    Number of Children: N/A  . Years of Education: N/A   Occupational History  . Not on file.   Social History Main Topics  . Smoking status: Never Smoker   . Smokeless tobacco: Not on file  . Alcohol Use: No  . Drug Use: No  . Sexual Activity:    Other Topics Concern  . Not on file   Social History Narrative   She is widowed.    Health Maintenance  Topic Date Due  . Mammogram  12/11/2013  . Influenza Vaccine  03/13/2014  . Pap Smear  12/12/2014  . Tetanus/tdap  04/23/2017  . Colonoscopy  11/22/2021    The following portions of the patient's history were reviewed and updated as appropriate: allergies, current medications, past family history, past medical history, past social history, past surgical history and problem list.  Review of Systems A comprehensive review of systems was negative.   Objective:    BP 134/79  Pulse 91  Ht 5\' 6"  (1.676 m)  Wt 114 lb (51.71 kg)  BMI 18.41 kg/m2 General appearance: alert, cooperative and appears stated age Head: Normocephalic, without obvious abnormality, atraumatic Eyes: conj clear, EOMik PEERLA Ears: normal TM's and external ear canals both ears Nose: Nares normal.  Septum midline. Mucosa normal. No drainage or sinus tenderness. Throat: lips, mucosa, and tongue normal; teeth and gums normal Neck: no adenopathy, no carotid bruit, no JVD, supple, symmetrical, trachea midline and thyroid not enlarged, symmetric, no tenderness/mass/nodules Back: symmetric, no curvature. ROM normal. No CVA tenderness. Lungs: clear to auscultation bilaterally Heart: regular rate and rhythm, S1, S2 normal, no murmur, click, rub or gallop Abdomen: soft, non-tender; bowel sounds normal; no masses,  no organomegaly Extremities: extremities normal, atraumatic, no cyanosis or edema Pulses: 2+ and symmetric Skin: Skin color, texture, turgor normal. No rashes or lesions Lymph nodes: Cervical, supraclavicular, and axillary nodes normal. Neurologic: Alert and oriented X 3, normal strength and tone. Normal symmetric reflexes. Normal coordination and gait    Assessment:    Healthy female exam.      Plan:     See After Visit Summary for Counseling Recommendations  Keep up a regular exercise program and make sure you are eating a healthy diet Try to eat 4 servings of dairy a day, or if you are lactose intolerant take a calcium with vitamin D daily.  Your vaccines are up to date.   HTN- Well controlled. We'll switch back to [place] for her age but we'll use a lower dose. exforge to 5/160. Thirdly this will help control her rate notes again. FU in 6 mo.   Atypical chest pain - EKG  rate of 71 bpm, no acute changes. See scanned EKG.

## 2013-11-12 ENCOUNTER — Encounter: Payer: Self-pay | Admitting: *Deleted

## 2013-11-12 LAB — VITAMIN B12: Vitamin B-12: 490 pg/mL (ref 211–911)

## 2013-12-25 ENCOUNTER — Ambulatory Visit (INDEPENDENT_AMBULATORY_CARE_PROVIDER_SITE_OTHER): Payer: BC Managed Care – PPO | Admitting: Family Medicine

## 2013-12-25 ENCOUNTER — Encounter: Payer: Self-pay | Admitting: Family Medicine

## 2013-12-25 ENCOUNTER — Ambulatory Visit (INDEPENDENT_AMBULATORY_CARE_PROVIDER_SITE_OTHER): Payer: BC Managed Care – PPO

## 2013-12-25 VITALS — BP 108/68 | HR 88 | Wt 116.0 lb

## 2013-12-25 DIAGNOSIS — R079 Chest pain, unspecified: Secondary | ICD-10-CM

## 2013-12-25 DIAGNOSIS — M5380 Other specified dorsopathies, site unspecified: Secondary | ICD-10-CM

## 2013-12-25 DIAGNOSIS — M542 Cervicalgia: Secondary | ICD-10-CM

## 2013-12-25 DIAGNOSIS — R1310 Dysphagia, unspecified: Secondary | ICD-10-CM

## 2013-12-25 DIAGNOSIS — R0781 Pleurodynia: Secondary | ICD-10-CM

## 2013-12-25 DIAGNOSIS — M5030 Other cervical disc degeneration, unspecified cervical region: Secondary | ICD-10-CM

## 2013-12-25 DIAGNOSIS — M954 Acquired deformity of chest and rib: Secondary | ICD-10-CM

## 2013-12-25 NOTE — Progress Notes (Signed)
Subjective:    Patient ID: Patricia Barajas, female    DOB: 02/26/1961, 53 y.o.   MRN: [id number]  HPI She's noticed that her left upper ribs have been protruding a little bit more over the last couple months she denies any pain in that location that has had some twinging type pain in the right side of the chest and in the neck. Which she has never had before. She says it just lasts for a few minutes and it seems to ease off. It's not painful but is just uncomfortable. She works out and exercises regularly. She denies any known trauma or injury to the chest. She's never had any pulmonary or respiratory problems and denies any shortness of breath or palpitations. She has not had any significant weight changes. She denies any weakness in her extremities.   Review of Systems BP 108/68  Pulse 88  Wt 116 lb (52.617 kg)    Allergies  Allergen Reactions  . Sulfonamide Derivatives Hives  . Penicillins Rash    Past Medical History  Diagnosis Date  . Perimenopausal     Past Surgical History  Procedure Laterality Date  . Adenoidectomy    . Tubal ligation      History   Social History  . Marital Status: Widowed    Spouse Name: N/A    Number of Children: N/A  . Years of Education: N/A   Occupational History  . Not on file.   Social History Main Topics  . Smoking status: Never Smoker   . Smokeless tobacco: Not on file  . Alcohol Use: No  . Drug Use: No  . Sexual Activity:    Other Topics Concern  . Not on file   Social History Narrative   She is widowed.     Family History  Problem Relation Age of Onset  . Hypertension Mother   . Stroke Father   . Cancer Maternal Grandfather     brain    Outpatient Encounter Prescriptions as of 12/25/2013  Medication Sig  . amLODipine-valsartan (EXFORGE) 5-160 MG per tablet Take 1 tablet by mouth daily.  . Cinnamon 500 MG capsule Take 500 mg by mouth daily.  . Flaxseed, Linseed, (RA FLAX SEED OIL 1000) 1000 MG CAPS Take 2 capsules  by mouth daily.  . Misc Natural Products (LUTEIN 20 PO) Take 1 tablet by mouth daily.  Marland Kitchen. Specialty Vitamins Products (MAGNESIUM, AMINO ACID CHELATE,) 133 MG tablet Take 1 tablet by mouth 2 (two) times daily.           Objective:   Physical Exam  Constitutional: She is oriented to person, place, and time. She appears well-developed and well-nourished.  HENT:  Head: Normocephalic and atraumatic.  Right Ear: External ear normal.  Left Ear: External ear normal.  Nose: Nose normal.  Mouth/Throat: Oropharynx is clear and moist.  TMs and canals are clear.   Eyes: Conjunctivae and EOM are normal. Pupils are equal, round, and reactive to light.  Neck: Neck supple. No thyromegaly present.  Cardiovascular: Normal rate, regular rhythm and normal heart sounds.   Pulmonary/Chest: Effort normal and breath sounds normal. She has no wheezes.  Musculoskeletal: She exhibits no edema.  No sciolosis of the spine. Neck with normal range of motion. Upper extremities with normal range of motion and normal strength. Her second ribs on the left does tend to protrude more as well as the clavicle. I do not palpate distinct lesion or not or mass on top of the rib  it does feel like the rib itself protrudes out. She is nontender on exam today.  Lymphadenopathy:    She has no cervical adenopathy.  Neurological: She is alert and oriented to person, place, and time.  Skin: Skin is warm and dry.  Psychiatric: She has a normal mood and affect. Her behavior is normal.          Assessment & Plan:  Rib abnormality-she isn't really having pain over the area so much as it protrudes and is much more prominent than the right side of her chest. She says that this is not new. She's never had a diagnosis of scoliosis or rotation of the spine and has no evidence of scoliosis today. This is a little bit unusual. Fortunately she's not really having any cardiac or pulmonary symptoms. She also recently had blood work including a  CMP lipids etc. that was normal. She had a normal thyroid level last summer. We'll get chest x-ray as well as rib film. She's also worried about her neck since she's noticed some d discomfort with swallowing. She doesn't feel like she is choking or gagging in the 2 does move through but sometimes it's uncomfortable. This is also new so we'll get cervical spine film as well. No thyromegaly on exam today.  Pain with swallowing-will get cervical from x-ray as well. If negative then consider further evaluation with EGD or soft tissue imaging of the neck.

## 2014-03-17 LAB — BASIC METABOLIC PANEL
Creatinine: 0.8 mg/dL (ref 0.5–1.1)
GLUCOSE: 81 mg/dL
Glucose: 81 mg/dL
Potassium: 4.6 mmol/L (ref 3.4–5.3)
Sodium: 142 mmol/L (ref 137–147)

## 2014-03-17 LAB — LIPID PANEL
Cholesterol: 187 mg/dL (ref 0–200)
HDL: 93 mg/dL — AB (ref 35–70)
LDL CALC: 77 mg/dL
Triglycerides: 83 mg/dL (ref 40–160)

## 2014-03-17 LAB — CBC AND DIFFERENTIAL
Hemoglobin: 12.6 g/dL (ref 12.0–16.0)
PLATELETS: 233 10*3/uL (ref 150–399)

## 2014-03-17 LAB — CALCIUM: Calcium: 9.3 mg/dL

## 2014-03-17 LAB — HEPATIC FUNCTION PANEL
ALT: 13 U/L (ref 7–35)
AST: 21 U/L (ref 13–35)

## 2014-03-17 LAB — URIC ACID: URIC ACID, SERUM: 81

## 2014-03-29 ENCOUNTER — Encounter: Payer: Self-pay | Admitting: Family Medicine

## 2014-04-08 ENCOUNTER — Encounter: Payer: Self-pay | Admitting: Family Medicine

## 2014-05-03 ENCOUNTER — Encounter: Payer: Self-pay | Admitting: Family Medicine

## 2014-05-03 ENCOUNTER — Ambulatory Visit (INDEPENDENT_AMBULATORY_CARE_PROVIDER_SITE_OTHER): Payer: BC Managed Care – PPO | Admitting: Family Medicine

## 2014-05-03 VITALS — BP 117/78 | HR 78 | Temp 98.1°F | Wt 117.0 lb

## 2014-05-03 DIAGNOSIS — Z23 Encounter for immunization: Secondary | ICD-10-CM | POA: Diagnosis not present

## 2014-05-03 DIAGNOSIS — I10 Essential (primary) hypertension: Secondary | ICD-10-CM

## 2014-05-03 DIAGNOSIS — F4321 Adjustment disorder with depressed mood: Secondary | ICD-10-CM

## 2014-05-03 MED ORDER — AMLODIPINE BESYLATE-VALSARTAN 5-160 MG PO TABS: 1.0000 | ORAL_TABLET | Freq: Every day | ORAL | Status: DC

## 2014-05-03 NOTE — Progress Notes (Signed)
   Subjective:    Patient ID: Patricia Barajas, female    DOB: June 10, 1961, 53 y.o.   MRN: [id number]  Hypertension  Hypertension- Pt denies chest pain, SOB, dizziness, or heart palpitations.  Taking meds as directed w/o problems.  Denies medication side effects.  On Exforge.  Patient had recent blood work in August which showed a normal kidney function.   she has been struggling emotionally since her husband passed away a few years ago. In fact she's take about 5 weeks a personal leave off so that she can really focus on herself. She just is getting to the point where she's feeling overwhelmed. She has been Hydrologist and that has been helpful. She really wants to avoid medications at this time.ng a  Review of Systems     Objective:   Physical Exam  Constitutional: She is oriented to person, place, and time. She appears well-developed and well-nourished.  HENT:  Head: Normocephalic and atraumatic.  Cardiovascular: Normal rate, regular rhythm and normal heart sounds.   Pulmonary/Chest: Effort normal and breath sounds normal.  Neurological: She is alert and oriented to person, place, and time.  Skin: Skin is warm and dry.  Psychiatric: She has a normal mood and affect. Her behavior is normal.          Assessment & Plan:  HTN- well controlled. F/U in 6 mo. Labs are UTD.   Edgardo Roys - seeing therapist.

## 2014-11-12 ENCOUNTER — Other Ambulatory Visit: Payer: Self-pay | Admitting: Family Medicine

## 2014-11-12 ENCOUNTER — Ambulatory Visit (INDEPENDENT_AMBULATORY_CARE_PROVIDER_SITE_OTHER): Payer: BLUE CROSS/BLUE SHIELD | Admitting: Family Medicine

## 2014-11-12 ENCOUNTER — Encounter: Payer: Self-pay | Admitting: Family Medicine

## 2014-11-12 VITALS — BP 113/66 | HR 97 | Ht 66.0 in | Wt 113.0 lb

## 2014-11-12 DIAGNOSIS — I7300 Raynaud's syndrome without gangrene: Secondary | ICD-10-CM | POA: Diagnosis not present

## 2014-11-12 DIAGNOSIS — I10 Essential (primary) hypertension: Secondary | ICD-10-CM

## 2014-11-12 DIAGNOSIS — Z0189 Encounter for other specified special examinations: Secondary | ICD-10-CM

## 2014-11-12 DIAGNOSIS — Z0000 Encounter for general adult medical examination without abnormal findings: Secondary | ICD-10-CM

## 2014-11-12 LAB — COMPLETE METABOLIC PANEL WITH GFR
ALT: 14 U/L (ref 0–35)
AST: 21 U/L (ref 0–37)
Albumin: 4.5 g/dL (ref 3.5–5.2)
Alkaline Phosphatase: 59 U/L (ref 39–117)
BILIRUBIN TOTAL: 1 mg/dL (ref 0.2–1.2)
BUN: 5 mg/dL — AB (ref 6–23)
CHLORIDE: 103 meq/L (ref 96–112)
CO2: 27 mEq/L (ref 19–32)
CREATININE: 0.75 mg/dL (ref 0.50–1.10)
Calcium: 9.4 mg/dL (ref 8.4–10.5)
GFR, Est Non African American: >89 mL/min
Glucose, Bld: 87 mg/dL (ref 70–99)
Potassium: 3.7 mEq/L (ref 3.5–5.3)
Sodium: 140 mEq/L (ref 135–145)
Total Protein: 7.1 g/dL (ref 6.0–8.3)

## 2014-11-12 LAB — LIPID PANEL
CHOLESTEROL: 182 mg/dL (ref 0–200)
HDL: 87 mg/dL (ref >=46)
LDL Cholesterol: 76 mg/dL (ref 0–99)
TRIGLYCERIDES: 93 mg/dL (ref <150)
Total CHOL/HDL Ratio: 2.1 Ratio
VLDL: 19 mg/dL (ref 0–40)

## 2014-11-12 MED ORDER — AMLODIPINE BESYLATE-VALSARTAN 10-160 MG PO TABS: 1.0000 | ORAL_TABLET | Freq: Every day | ORAL | Status: DC

## 2014-11-12 NOTE — Progress Notes (Signed)
  Subjective:     Patricia Barajas is a 54 y.o. female and is here for a comprehensive physical exam. The patient reports no problems.  History   Social History  . Marital Status: Widowed    Spouse Name: N/A  . Number of Children: N/A  . Years of Education: N/A   Occupational History  . Not on file.   Social History Main Topics  . Smoking status: Never Smoker   . Smokeless tobacco: Not on file  . Alcohol Use: No  . Drug Use: No  . Sexual Activity: Not on file   Other Topics Concern  . Not on file   Social History Narrative   She is widowed.    Health Maintenance  Topic Date Due  . Hepatitis C Screening  08/16/60  . HIV Screening  03/19/1976  . PAP SMEAR  12/12/2014  . INFLUENZA VACCINE  03/14/2015  . MAMMOGRAM  02/16/2016  . TETANUS/TDAP  04/23/2017  . COLONOSCOPY  11/22/2021    The following portions of the patient's history were reviewed and updated as appropriate: allergies, current medications, past family history, past medical history, past social history, past surgical history and problem list.  Review of Systems A comprehensive review of systems was negative.   Objective:    BP 113/66 mmHg  Pulse 97  Ht 5\' 6"  (1.676 m)  Wt 113 lb (51.256 kg)  BMI 18.25 kg/m2 General appearance: alert, cooperative and appears stated age Head: Normocephalic, without obvious abnormality, atraumatic Eyes: conj clear, EOMi, PEERLA Ears: normal TM's and external ear canals both ears Nose: Nares normal. Septum midline. Mucosa normal. No drainage or sinus tenderness. Throat: lips, mucosa, and tongue normal; teeth and gums normal Neck: no adenopathy, no carotid bruit, no JVD, supple, symmetrical, trachea midline and thyroid not enlarged, symmetric, no tenderness/mass/nodules Back: symmetric, no curvature. ROM normal. No CVA tenderness. Lungs: clear to auscultation bilaterally Heart: regular rate and rhythm, S1, S2 normal, no murmur, click, rub or gallop Abdomen: soft,  non-tender; bowel sounds normal; no masses,  no organomegaly Extremities: extremities normal, atraumatic, no cyanosis or edema Pulses: 2+ and symmetric Skin: Skin color, texture, turgor normal. No rashes or lesions Lymph nodes: Cervical, supraclavicular, and axillary nodes normal. Neurologic: Alert and oriented X 3, normal strength and tone. Normal symmetric reflexes. Normal coordination and gait    Assessment:    Healthy female exam.     Plan:     See After Visit Summary for Counseling Recommendations   Keep up a regular exercise program and make sure you are eating a healthy diet Try to eat 4 servings of dairy a day, or if you are lactose intolerant take a calcium with vitamin D daily.  Your vaccines are up to date.   She would like to go up on the exforge to 10mg  for her raynauds. Has been bothering  Her more lately.  HTN - well controlled. F/U in 6 months.

## 2014-11-12 NOTE — Patient Instructions (Addendum)
Non hormonal treatment for menopause include Effexor, Prozac, and Celexa.   Keep up a regular exercise program and make sure you are eating a healthy diet Try to eat 4 servings of dairy a day, or if you are lactose intolerant take a calcium with vitamin D daily.  Your vaccines are up to date.

## 2014-11-13 LAB — TSH: TSH: 1.524 u[IU]/mL (ref 0.350–4.500)

## 2015-02-04 ENCOUNTER — Emergency Department
Admission: EM | Admit: 2015-02-04 | Discharge: 2015-02-04 | Disposition: A | Discharge status: Home / Self Care | Payer: BLUE CROSS/BLUE SHIELD | Source: Home / Self Care | Attending: Family Medicine | Admitting: Family Medicine

## 2015-02-04 ENCOUNTER — Encounter: Payer: Self-pay | Admitting: Emergency Medicine

## 2015-02-04 DIAGNOSIS — R350 Frequency of micturition: Secondary | ICD-10-CM

## 2015-02-04 LAB — POCT URINALYSIS DIP (MANUAL ENTRY)
Bilirubin, UA: NEGATIVE
Glucose, UA: NEGATIVE
Ketones, POC UA: NEGATIVE
LEUKOCYTES UA: NEGATIVE
Nitrite, UA: NEGATIVE
PROTEIN UA: NEGATIVE
Spec Grav, UA: <=1.005 (ref 1.005–1.03)
Urobilinogen, UA: 0.2 (ref 0–1)
pH, UA: 6 (ref 5–8)

## 2015-02-04 MED ORDER — NITROFURANTOIN MONOHYD MACRO 100 MG PO CAPS: 100.0000 mg | ORAL_CAPSULE | Freq: Two times a day (BID) | ORAL | Status: DC

## 2015-02-04 NOTE — ED Provider Notes (Signed)
CSN: [id number]     Arrival date & time 02/04/15  1802 History   First MD Initiated Contact with Patient 02/04/15 1917     Chief Complaint  Patient presents with  . Urinary Frequency      HPI Comments: Patient complains of two day history of nocturia, mild lower abdominal pressure sensation, and hesitancy but no dysuria.  No fevers, chills, and sweats.  No back pain.  No nausea/vomiting.  No recent antibiotic use.  Patient is a 54 y.o. female presenting with frequency. The history is provided by the patient.  Urinary Frequency This is a new problem. The current episode started 2 days ago. The problem occurs constantly. The problem has not changed since onset.Pertinent negatives include no abdominal pain. Nothing aggravates the symptoms. Nothing relieves the symptoms. She has tried nothing for the symptoms.    Past Medical History  Diagnosis Date  . Perimenopausal    Past Surgical History  Procedure Laterality Date  . Adenoidectomy    . Tubal ligation     Family History  Problem Relation Age of Onset  . Hypertension Mother   . Stroke Father   . Cancer Maternal Grandfather     brain   History  Substance Use Topics  . Smoking status: Never Smoker   . Smokeless tobacco: Not on file  . Alcohol Use: No   OB History    No data available     Review of Systems  Constitutional: Negative for fever, chills and fatigue.  Gastrointestinal: Negative for nausea and abdominal pain.  Genitourinary: Positive for frequency. Negative for dysuria, urgency, hematuria, flank pain, decreased urine volume, vaginal bleeding, vaginal discharge, difficulty urinating, vaginal pain and pelvic pain.  All other systems reviewed and are negative.   Allergies  Sulfonamide derivatives and Penicillins  Home Medications   Prior to Admission medications   Medication Sig Start Date End Date Taking? Authorizing Provider  amLODipine-valsartan (EXFORGE) 10-160 MG per tablet Take 1 tablet by mouth daily.  11/12/14   Agapito Games, MD  Cinnamon 500 MG capsule Take 500 mg by mouth daily.    Historical Provider, MD  Flaxseed, Linseed, (RA FLAX SEED OIL 1000) 1000 MG CAPS Take 2 capsules by mouth daily.    Historical Provider, MD  Misc Natural Products (LUTEIN 20 PO) Take 1 tablet by mouth daily.    Historical Provider, MD  nitrofurantoin, macrocrystal-monohydrate, (MACROBID) 100 MG capsule Take 1 capsule (100 mg total) by mouth 2 (two) times daily. Take with food. 02/04/15   Lattie Haw, MD  Specialty Vitamins Products (MAGNESIUM, AMINO ACID CHELATE,) 133 MG tablet Take 1 tablet by mouth 2 (two) times daily.    Historical Provider, MD   BP 119/75 mmHg  Pulse 78  Temp(Src) 98.3 F (36.8 C) (Oral)  Resp 16  Ht  (1.676 m)  Wt 114 lb (51.71 kg)  BMI 18.41 kg/m2  SpO2 98% Physical Exam Nursing notes and Vital Signs reviewed. Appearance:  Patient appears stated age, and in no acute distress Eyes:  Pupils are equal, round, and reactive to light and accomodation.  Extraocular movement is intact.  Conjunctivae are not inflamed  Nose:  Normal Pharynx:  Normal; moist mucous membranes  Neck:  Supple.  No adenopathy Lungs:  Clear to auscultation.  Breath sounds are equal.  Heart:  Regular rate and rhythm without murmurs, rubs, or gallops.  Abdomen:  Nontender without masses or hepatosplenomegaly.  Bowel sounds are present.  No CVA or flank tenderness.  Extremities:  No edema.  No calf tenderness Skin:  No rash present.   ED Course  Procedures  None    Labs Reviewed  POCT URINALYSIS DIP (MANUAL ENTRY) - Abnormal; Notable for the following:    Color, UA light yellow (*)    Blood, UA small (*)    All other components within normal limits  URINE CULTURE      MDM   1. Urinary frequency    Urine culture pending.  Patient prefers to await results of culture before starting antibiotic. Rx for Macrobid 100mg  BID. Continue increased fluid intake. Followup with Family Doctor if not  improved in one week.     Lattie Haw, MD 02/08/15 [phone]

## 2015-02-04 NOTE — Discharge Instructions (Signed)
Continue increased fluid intake.   Urinary Frequency The number of times a normal person urinates depends upon how much liquid they take in and how much liquid they are losing. If the temperature is hot and there is high humidity, then the person will sweat more and usually breathe a little more frequently. These factors decrease the amount of frequency of urination that would be considered normal. The amount you drink is easily determined, but the amount of fluid lost is sometimes more difficult to calculate.  Fluid is lost in two ways:  Sensible fluid loss is usually measured by the amount of urine that you get rid of. Losses of fluid can also occur with diarrhea.  Insensible fluid loss is more difficult to measure. It is caused by evaporation. Insensible loss of fluid occurs through breathing and sweating. It usually ranges from a little less than a quart to a little more than a quart of fluid a day. In normal temperatures and activity levels, the average person may urinate 4 to 7 times in a 24-hour period. Needing to urinate more often than that could indicate a problem. If one urinates 4 to 7 times in 24 hours and has large volumes each time, that could indicate a different problem from one who urinates 4 to 7 times a day and has small volumes. The time of urinating is also important. Most urinating should be done during the waking hours. Getting up at night to urinate frequently can indicate some problems. CAUSES  The bladder is the organ in your lower abdomen that holds urine. Like a balloon, it swells some as it fills up. Your nerves sense this and tell you it is time to head for the bathroom. There are a number of reasons that you might feel the need to urinate more often than usual. They include:  Urinary tract infection. This is usually associated with other signs such as burning when you urinate.  In men, problems with the prostate (a walnut-size gland that is located near the tube that  carries urine out of your body). There are two reasons why the prostate can cause an increased frequency of urination:  An enlarged prostate that does not let the bladder empty well. If the bladder only half empties when you urinate, then it only has half the capacity to fill before you have to urinate again.  The nerves in the bladder become more hypersensitive with an increased size of the prostate even if the bladder empties completely.  Pregnancy.  Obesity. Excess weight is more likely to cause a problem for women than for men.  Bladder stones or other bladder problems.  Caffeine.  Alcohol.  Medications. For example, drugs that help the body get rid of extra fluid (diuretics) increase urine production. Some other medicines must be taken with lots of fluids.  Muscle or nerve weakness. This might be the result of a spinal cord injury, a stroke, multiple sclerosis, or Parkinson disease.  Long-standing diabetes can decrease the sensation of the bladder. This loss of sensation makes it harder to sense the bladder needs to be emptied. Over a period of years, the bladder is stretched out by constant overfilling. This weakens the bladder muscles so that the bladder does not empty well and has less capacity to fill with new urine.  Interstitial cystitis (also called painful bladder syndrome). This condition develops because the tissues that line the inside of the bladder are inflamed (inflammation is the body's way of reacting to injury or  infection). It causes pain and frequent urination. It occurs in women more often than in men. DIAGNOSIS   To decide what might be causing your urinary frequency, your health care provider will probably:  Ask about symptoms you have noticed.  Ask about your overall health. This will include questions about any medications you are taking.  Do a physical examination.  Order some tests. These might include:  A blood test to check for diabetes or other  health issues that could be contributing to the problem.  Urine testing. This could measure the flow of urine and the pressure on the bladder.  A test of your neurological system (the brain, spinal cord, and nerves). This is the system that senses the need to urinate.  A bladder test to check whether it is emptying completely when you urinate.  Cystoscopy. This test uses a thin tube with a tiny camera on it. It offers a look inside your urethra and bladder to see if there are problems.  Imaging tests. You might be given a contrast dye and then asked to urinate. X-rays are taken to see how your bladder is working. TREATMENT  It is important for you to be evaluated to determine if the amount or frequency that you have is unusual or abnormal. If it is found to be abnormal, the cause should be determined and this can usually be found out easily. Depending upon the cause, treatment could include medication, stimulation of the nerves, or surgery. There are not too many things that you can do as an individual to change your urinary frequency. It is important that you balance the amount of fluid intake needed to compensate for your activity and the temperature. Medical problems will be diagnosed and taken care of by your physician. There is no particular bladder training such as Kegel exercises that you can do to help urinary frequency. This is an exercise that is usually recommended for people who have leaking of urine when they laugh, cough, or sneeze. HOME CARE INSTRUCTIONS   Take any medications your health care provider prescribed or suggested. Follow the directions carefully.  Practice any lifestyle changes that are recommended. These might include:  Drinking less fluid or drinking at different times of the day. If you need to urinate often during the night, for example, you may need to stop drinking fluids early in the evening.  Cutting down on caffeine or alcohol. They both can make you need to  urinate more often than normal. Caffeine is found in coffee, tea, and sodas.  Losing weight, if that is recommended.  Keep a journal or a log. You might be asked to record how much you drink and when and where you feel the need to urinate. This will also help evaluate how well the treatment provided by your physician is working. SEEK MEDICAL CARE IF:   Your need to urinate often gets worse.  You feel increased pain or irritation when you urinate.  You notice blood in your urine.  You have questions about any medications that your health care provider recommended.  You notice blood, pus, or swelling at the site of any test or treatment procedure.  You develop a fever of more than 100.24F (38.1C). SEEK IMMEDIATE MEDICAL CARE IF:  You develop a fever of more than 102.27F (38.9C). Document Released: 05/26/2009 Document Revised: 12/14/2013 Document Reviewed: 05/26/2009 [facility] Patient Information 2015 [place], [place]. This information is not intended to replace advice given to you by your health care provider. Make  sure you discuss any questions you have with your health care provider.

## 2015-02-04 NOTE — ED Notes (Signed)
Reports 3 days of urinary frequency and a sensation of fullness in pelvis.

## 2015-02-05 LAB — URINE CULTURE
Colony Count: NO GROWTH
ORGANISM ID, BACTERIA: NO GROWTH

## 2015-02-08 ENCOUNTER — Telehealth: Payer: Self-pay | Admitting: *Deleted

## 2015-02-21 ENCOUNTER — Encounter: Payer: Self-pay | Admitting: Family Medicine

## 2015-02-21 ENCOUNTER — Ambulatory Visit (INDEPENDENT_AMBULATORY_CARE_PROVIDER_SITE_OTHER): Payer: BLUE CROSS/BLUE SHIELD | Admitting: Family Medicine

## 2015-02-21 VITALS — BP 93/55 | HR 82 | Temp 98.3°F | Ht 66.0 in | Wt 113.0 lb

## 2015-02-21 DIAGNOSIS — R350 Frequency of micturition: Secondary | ICD-10-CM | POA: Diagnosis not present

## 2015-02-21 DIAGNOSIS — R319 Hematuria, unspecified: Secondary | ICD-10-CM

## 2015-02-21 DIAGNOSIS — R6889 Other general symptoms and signs: Secondary | ICD-10-CM | POA: Diagnosis not present

## 2015-02-21 DIAGNOSIS — M7989 Other specified soft tissue disorders: Secondary | ICD-10-CM | POA: Diagnosis not present

## 2015-02-21 DIAGNOSIS — R209 Unspecified disturbances of skin sensation: Secondary | ICD-10-CM

## 2015-02-21 DIAGNOSIS — R351 Nocturia: Secondary | ICD-10-CM

## 2015-02-21 LAB — CBC WITH DIFFERENTIAL/PLATELET
BASOS ABS: 0.1 10*3/uL (ref 0.0–0.1)
Basophils Relative: 1 % (ref 0–1)
EOS ABS: 0.1 10*3/uL (ref 0.0–0.7)
Eosinophils Relative: 1 % (ref 0–5)
HEMATOCRIT: 38.9 % (ref 36.0–46.0)
HEMOGLOBIN: 13 g/dL (ref 12.0–15.0)
LYMPHS PCT: 25 % (ref 12–46)
Lymphs Abs: 1.6 10*3/uL (ref 0.7–4.0)
MCH: 31.8 pg (ref 26.0–34.0)
MCHC: 33.4 g/dL (ref 30.0–36.0)
MCV: 95.1 fL (ref 78.0–100.0)
MONO ABS: 0.5 10*3/uL (ref 0.1–1.0)
MPV: 9.9 fL (ref 8.6–12.4)
Monocytes Relative: 8 % (ref 3–12)
NEUTROS ABS: 4 10*3/uL (ref 1.7–7.7)
NEUTROS PCT: 65 % (ref 43–77)
PLATELETS: 256 10*3/uL (ref 150–400)
RBC: 4.09 MIL/uL (ref 3.87–5.11)
RDW: 12.6 % (ref 11.5–15.5)
WBC: 6.2 10*3/uL (ref 4.0–10.5)

## 2015-02-21 LAB — COMPLETE METABOLIC PANEL WITH GFR
ALK PHOS: 55 U/L (ref 39–117)
ALT: 15 U/L (ref 0–35)
AST: 20 U/L (ref 0–37)
Albumin: 4.3 g/dL (ref 3.5–5.2)
BUN: 6 mg/dL (ref 6–23)
CALCIUM: 9.6 mg/dL (ref 8.4–10.5)
CHLORIDE: 104 meq/L (ref 96–112)
CO2: 28 mEq/L (ref 19–32)
Creat: 0.68 mg/dL (ref 0.50–1.10)
Glucose, Bld: 87 mg/dL (ref 70–99)
Potassium: 4.4 mEq/L (ref 3.5–5.3)
Sodium: 141 mEq/L (ref 135–145)
Total Bilirubin: 0.9 mg/dL (ref 0.2–1.2)
Total Protein: 7 g/dL (ref 6.0–8.3)

## 2015-02-21 LAB — POCT URINALYSIS DIPSTICK
Bilirubin, UA: NEGATIVE
GLUCOSE UA: NEGATIVE
Ketones, UA: NEGATIVE
Leukocytes, UA: NEGATIVE
Nitrite, UA: NEGATIVE
PROTEIN UA: NEGATIVE
SPEC GRAV UA: 1.015
Urobilinogen, UA: 0.2
pH, UA: 8

## 2015-02-21 LAB — TSH: TSH: 1.155 u[IU]/mL (ref 0.350–4.500)

## 2015-02-21 LAB — FERRITIN: FERRITIN: 81 ng/mL (ref 10–291)

## 2015-02-21 NOTE — Progress Notes (Signed)
Subjective:    Patient ID: Patricia Barajas, female    DOB: [birth year], 54 y.o.   MRN: [id number]  HPI She was seen at urgent care on 02/04/2015 for 2 days of nocturia and lower abdominal pressure. They did a culture and patient preferred to wait until culture returned before starting an antibiotic. Urine culture showed no growth. Took Macrobid and completed course of antibiotic with no significant change in her symptoms. The she does feel a little bit better today..  Friday morning pain with more intense but no pain with urination. Having a lot of fullness in her pelvis.  Says it comes and goes.  She is post menopausal.  No vaginal d/c or pain. No change in BMs. Pressure radiates into her bottom.  No real worsening or alleviating factors. It does tend to come and go.  Hs felt more cold than usual. Has had some ankle swelling for about 4 weeks. Has happened before in the summer so just thought it was that.   Review of Systems  BP 93/55 mmHg  Pulse 82  Temp(Src) 98.3 F (36.8 C)  Ht  (1.676 m)  Wt 113 lb (51.256 kg)  BMI 18.25 kg/m2    Allergies  Allergen Reactions  . Sulfonamide Derivatives Hives  . Penicillins Rash    Past Medical History  Diagnosis Date  . Perimenopausal     Past Surgical History  Procedure Laterality Date  . Adenoidectomy    . Tubal ligation      History   Social History  . Marital Status: Widowed    Spouse Name: N/A  . Number of Children: N/A  . Years of Education: N/A   Occupational History  . Not on file.   Social History Main Topics  . Smoking status: Never Smoker   . Smokeless tobacco: Not on file  . Alcohol Use: No  . Drug Use: No  . Sexual Activity: Not on file   Other Topics Concern  . Not on file   Social History Narrative   She is widowed.     Family History  Problem Relation Age of Onset  . Hypertension Mother   . Stroke Father   . Cancer Maternal Grandfather     brain    Outpatient Encounter Prescriptions as of  02/21/2015  Medication Sig  . amLODipine-valsartan (EXFORGE) 10-160 MG per tablet Take 1 tablet by mouth daily.  . Flaxseed, Linseed, (RA FLAX SEED OIL 1000) 1000 MG CAPS Take 2 capsules by mouth daily.  . Misc Natural Products (LUTEIN 20 PO) Take 1 tablet by mouth daily.  Marland Kitchen Specialty Vitamins Products (MAGNESIUM, AMINO ACID CHELATE,) 133 MG tablet Take 1 tablet by mouth 2 (two) times daily.  . [DISCONTINUED] Cinnamon 500 MG capsule Take 500 mg by mouth daily.  . [DISCONTINUED] nitrofurantoin, macrocrystal-monohydrate, (MACROBID) 100 MG capsule Take 1 capsule (100 mg total) by mouth 2 (two) times daily. Take with food.   No facility-administered encounter medications on file as of 02/21/2015.          Objective:   Physical Exam  Constitutional: She is oriented to person, place, and time. She appears well-developed and well-nourished.  HENT:  Head: Normocephalic and atraumatic.  Cardiovascular: Normal rate, regular rhythm and normal heart sounds.   Pulmonary/Chest: Effort normal and breath sounds normal.  Abdominal: Soft. Bowel sounds are normal. She exhibits no distension and no mass. There is tenderness. There is no rebound and no guarding.  TTP to left to the umbilicius.  Musculoskeletal:  No ankle edema  Neurological: She is alert and oriented to person, place, and time.  Skin: Skin is warm and dry.  Psychiatric: She has a normal mood and affect. Her behavior is normal.          Assessment & Plan:  Urinary frequency- UA shows small blood. Will send for micro review and culture.  If pos for RBCs, then repeat in one week to confirm. If second is positive then we'll pursue hematuria workup.   Pelvic pressure- In the meantime she actually has an appointment in about 2 weeks for her regular GYN care. She will have a pelvic exam performed at that point. Also consider moving forward with abdominal and pelvic ultrasound. She may even be able to do this through her OB/GYN.  Ankle  swelling - will check kidney and liver function.   Feeling cold - will check for anemia and low iron.

## 2015-02-22 LAB — URINALYSIS, MICROSCOPIC ONLY
Bacteria, UA: NONE SEEN
CASTS: NONE SEEN
Crystals: NONE SEEN
Squamous Epithelial / LPF: NONE SEEN

## 2015-02-22 LAB — URINE CULTURE
Colony Count: NO GROWTH
ORGANISM ID, BACTERIA: NO GROWTH

## 2015-03-01 ENCOUNTER — Encounter: Payer: Self-pay | Admitting: Family Medicine

## 2015-03-08 ENCOUNTER — Telehealth: Payer: Self-pay | Admitting: Family Medicine

## 2015-03-08 LAB — HEPATIC FUNCTION PANEL
ALT: 14 U/L (ref 7–35)
AST: 21 U/L (ref 13–35)
Alkaline Phosphatase: 80 U/L (ref 25–125)
Bilirubin, Direct: 0.09 mg/dL (ref 0.01–0.4)
Bilirubin, Total: 0.3 mg/dL

## 2015-03-08 LAB — URIC ACID: Uric Acid: 2.5

## 2015-03-08 LAB — LIPID PANEL
CHOLESTEROL: 184 mg/dL (ref 0–200)
HDL: 100 mg/dL — AB (ref 35–70)
LDL Cholesterol: 71 mg/dL
TRIGLYCERIDES: 64 mg/dL (ref 40–160)

## 2015-03-08 LAB — BASIC METABOLIC PANEL
BUN: 6 mg/dL (ref 4–21)
Creatinine: 0.6 mg/dL (ref 0.5–1.1)
Glucose: 88 mg/dL
Potassium: 4.5 mmol/L (ref 3.4–5.3)
Sodium: 140 mmol/L (ref 137–147)

## 2015-03-08 LAB — CBC AND DIFFERENTIAL
HEMATOCRIT: 40 % (ref 36–46)
Hemoglobin: 13.1 g/dL (ref 12.0–16.0)
Platelets: 268 10*3/uL (ref 150–399)
WBC: 6.8 10^3/mL

## 2015-03-08 NOTE — Telephone Encounter (Signed)
Please call patient: Received results for labwork. Kidney function and liver function look fantastic. Cholesterol looks fantastic as well. Serum iron looks great. Blood count is normal. No sign of anemia. All labs are essentially normal. Glucose was normal as well.

## 2015-03-08 NOTE — Telephone Encounter (Signed)
lvm informing pt on nl results.Loralee Pacas Nunn

## 2015-03-09 ENCOUNTER — Encounter: Payer: Self-pay | Admitting: Family Medicine

## 2015-04-23 IMAGING — CR DG CHEST 2V
2 series · 2 of 2 positions shown · non-contrast
Comparison: Two-view chest x-ray 04/08/2012.

CLINICAL DATA: Left second rib protrusion. Neck pain and discomfort
with swallowing.

EXAM:
CHEST  2 VIEW

[view not recorded (1 of 2)]
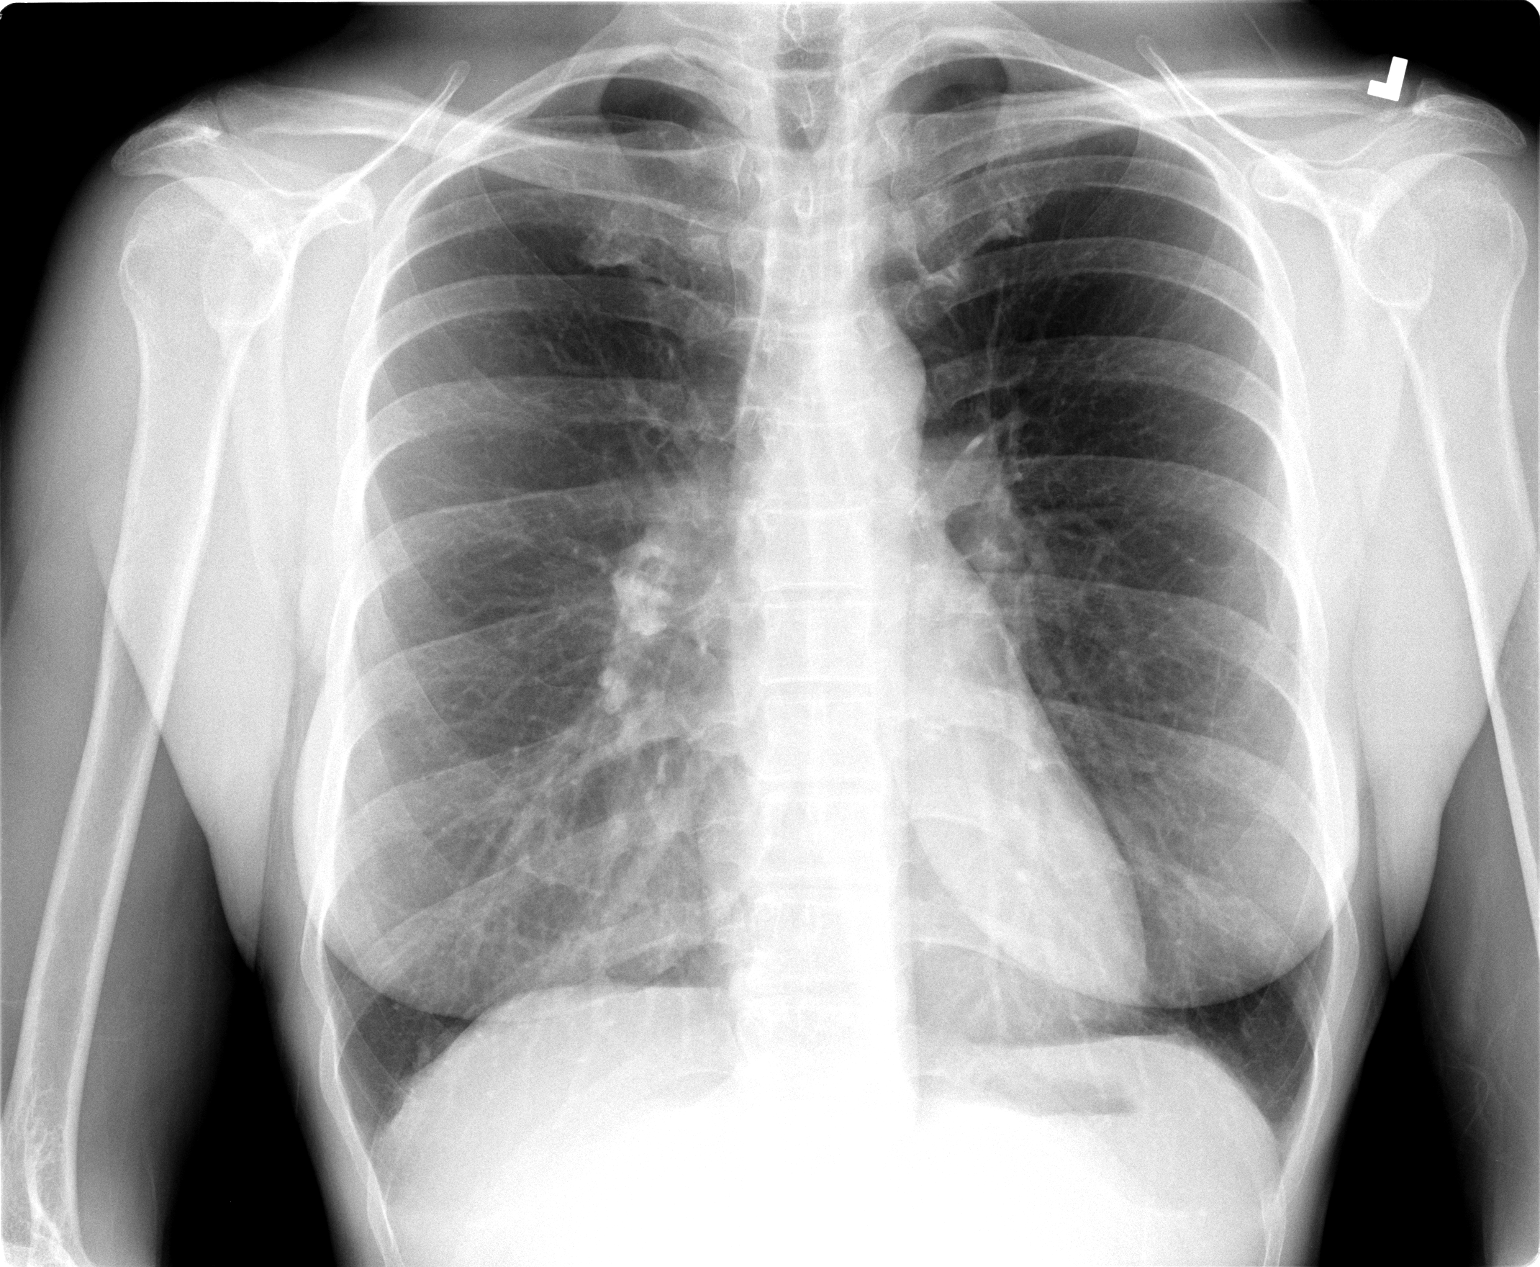

[view not recorded (2 of 2)]
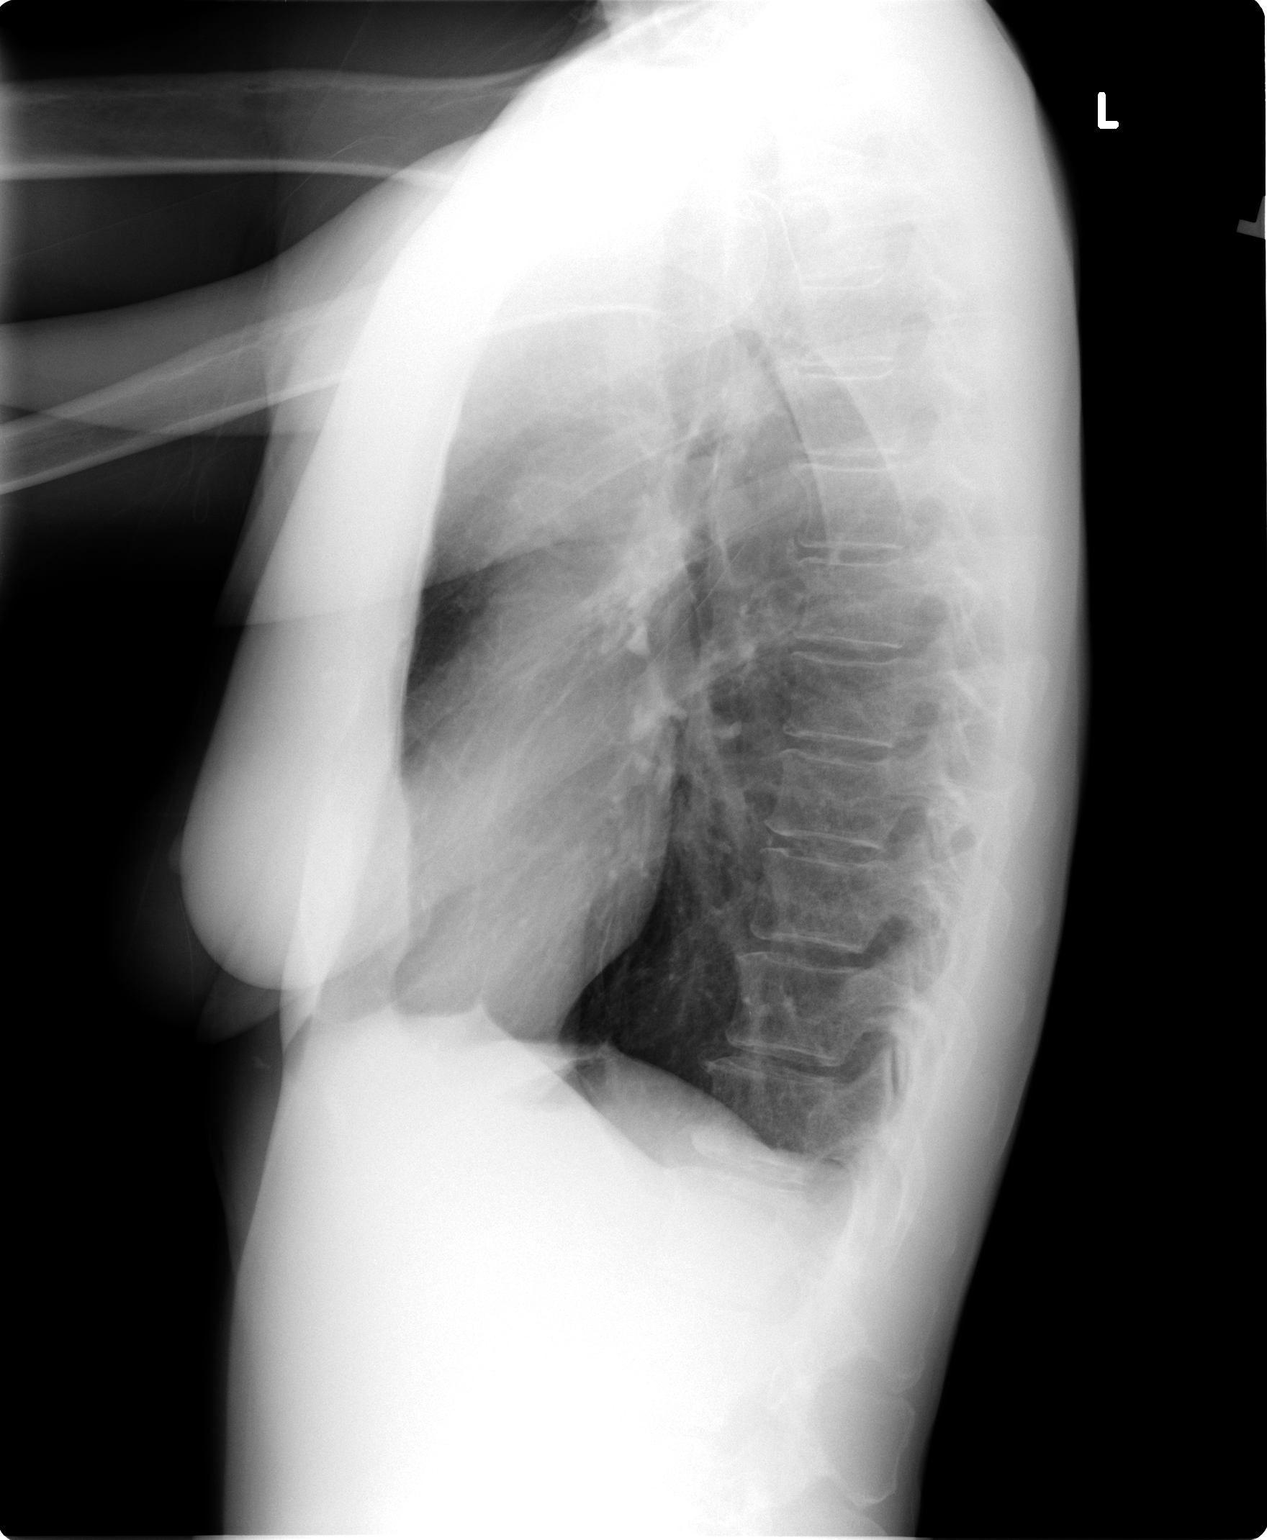

[2 of 2 positions shown; findings below may reference images not displayed]

FINDINGS: Heart size is normal. Degenerative changes are noted at the anterior
first rib bilaterally, left greater than right. The second rib is
unremarkable. The lungs are clear. The visualized soft tissues and
bony thorax are otherwise unremarkable.
IMPRESSION: 1. Degenerative changes along the anterior first ribs bilaterally,
left greater than right.
2. No acute cardiopulmonary disease.

## 2015-04-23 IMAGING — CR DG CERVICAL SPINE COMPLETE 4+V
5 series · 5 of 5 positions shown · non-contrast
Comparison: None.

CLINICAL DATA: Neck pain and discomfort with swallowing.

EXAM:
CERVICAL SPINE  4+ VIEWS

[view not recorded (1 of 5)]
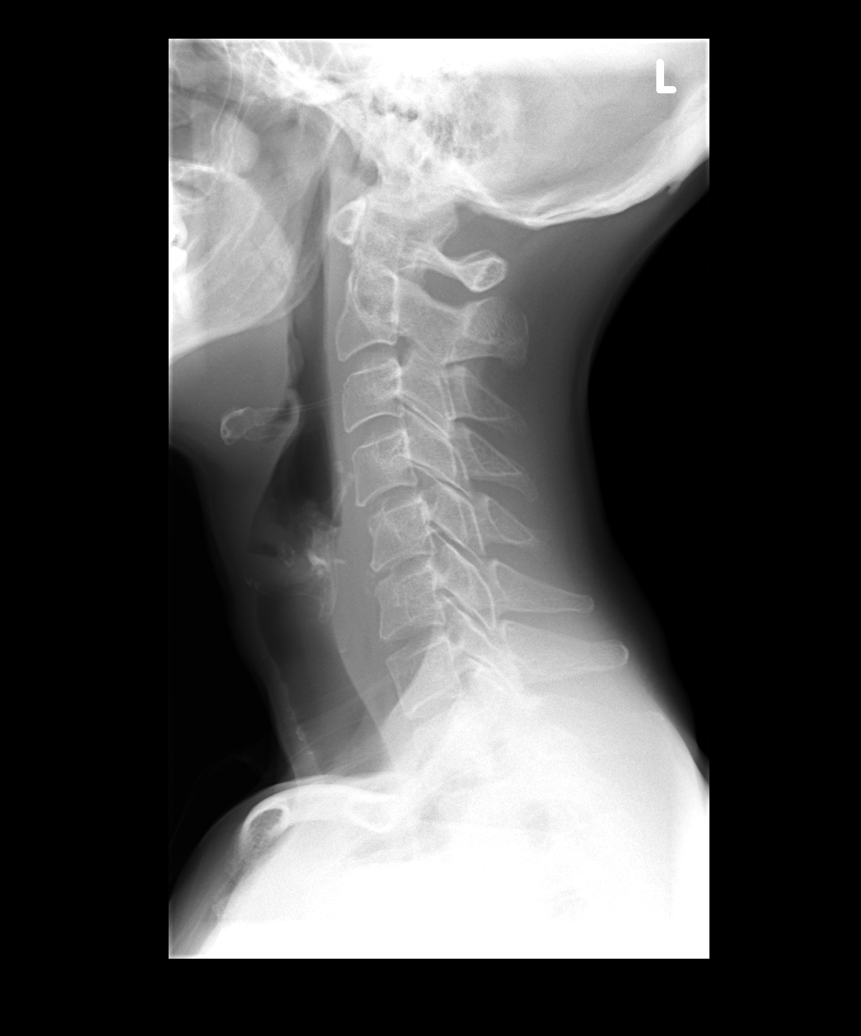

[view not recorded (2 of 5)]
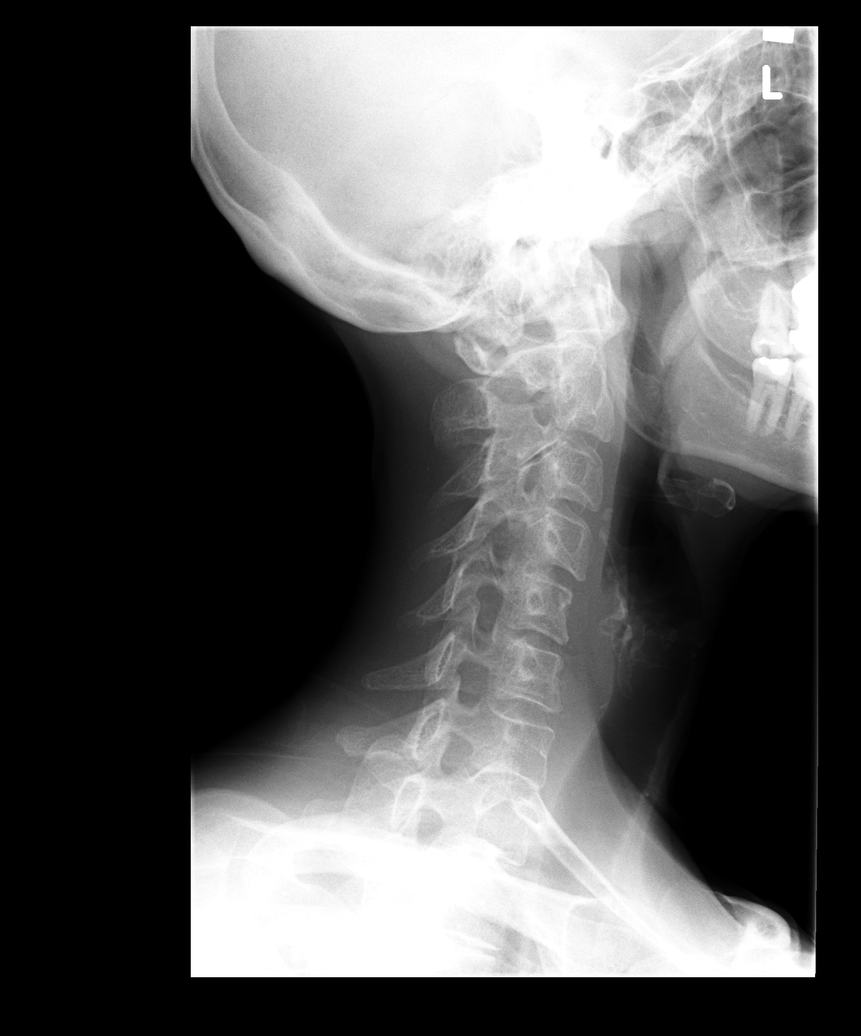

[view not recorded (3 of 5)]
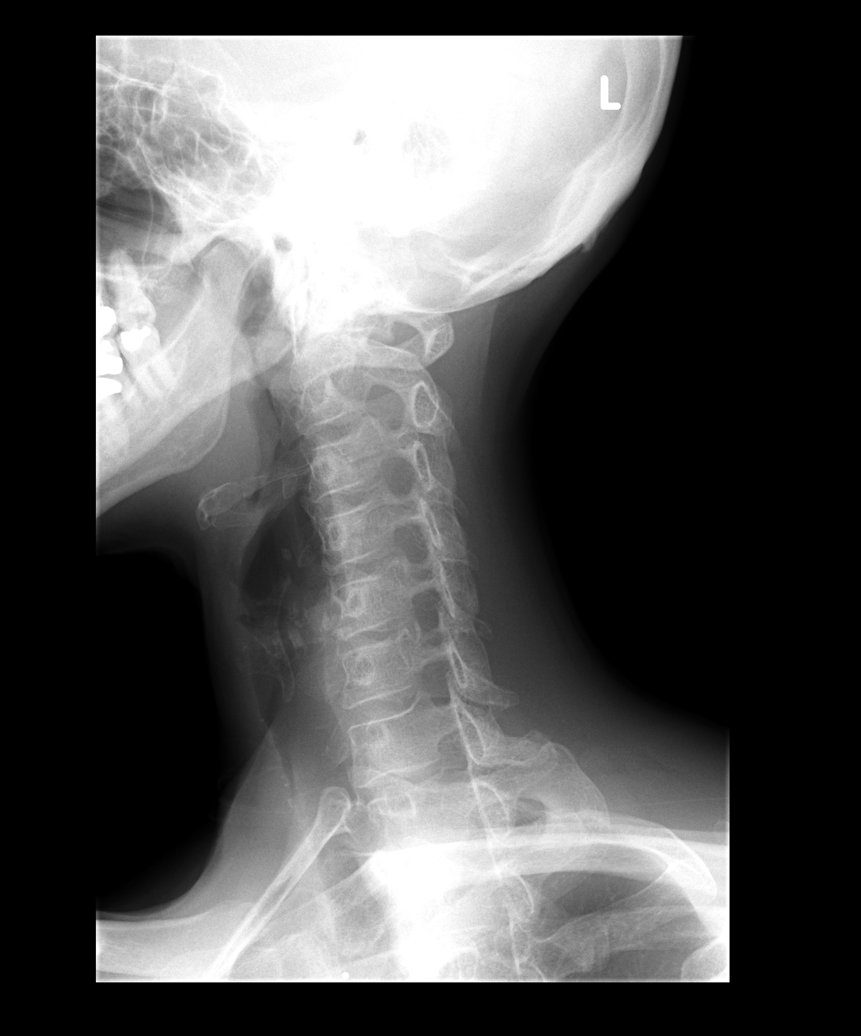

[view not recorded (4 of 5)]
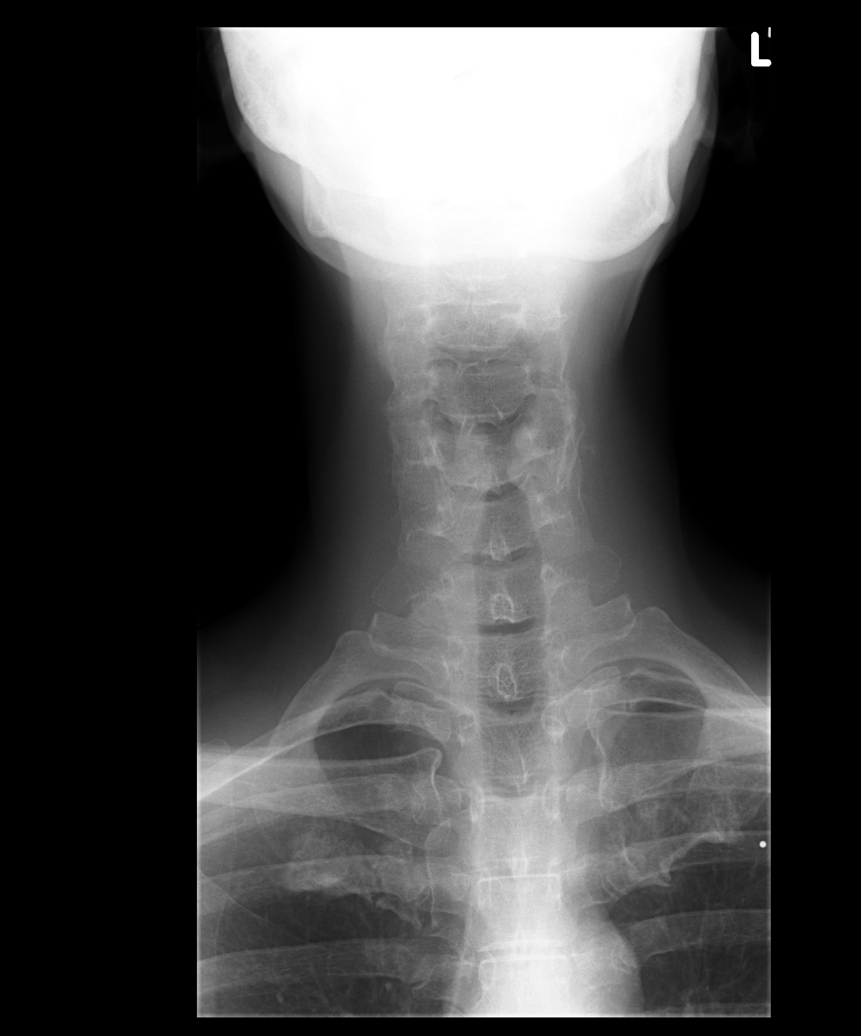

[view not recorded (5 of 5)]
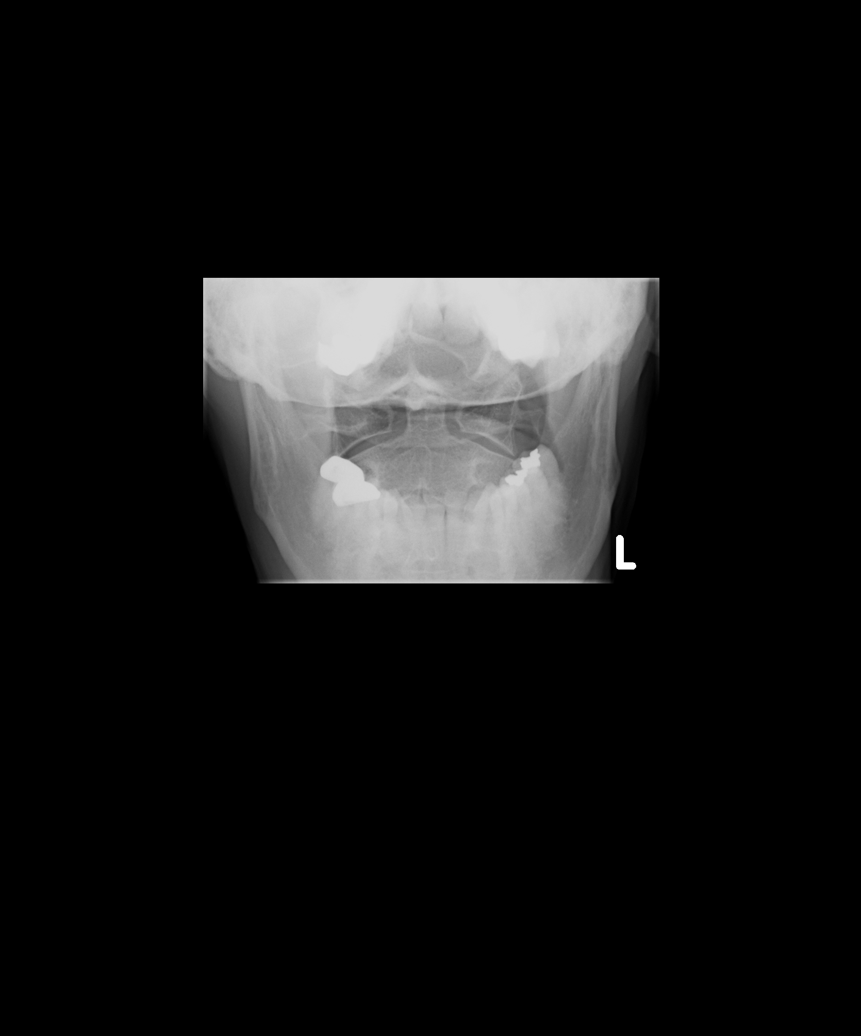

[5 of 5 positions shown; findings below may reference images not displayed]

FINDINGS: There is straightening of the normal cervical lordosis. There is
slight anterolisthesis of C4 on C5, and there is slight
retrolisthesis of C5 on C6. There is mild disc space narrowing and
minimal endplate spurring at C4-5 and C5-6. Prevertebral soft
tissues are within normal limits. No definite neural foraminal
narrowing is identified. Lateral masses of C1 are aligned with C2.
No soft tissue abnormality is identified. Visualized lung apices are
clear.
IMPRESSION: Cervical spine straightening with minimal listhesis and degenerative
disc disease at C4-5 and C5-6 as above.

## 2015-08-09 ENCOUNTER — Emergency Department
Admission: EM | Admit: 2015-08-09 | Discharge: 2015-08-09 | Discharge status: Absent without leave | Payer: BLUE CROSS/BLUE SHIELD | Source: Home / Self Care

## 2015-08-09 ENCOUNTER — Ambulatory Visit: Payer: Self-pay | Admitting: Family Medicine

## 2015-08-11 ENCOUNTER — Ambulatory Visit: Payer: Self-pay | Admitting: Family Medicine

## 2015-10-25 ENCOUNTER — Encounter: Payer: Self-pay | Admitting: Family Medicine

## 2015-10-27 ENCOUNTER — Other Ambulatory Visit: Payer: Self-pay | Admitting: Family Medicine

## 2015-11-02 ENCOUNTER — Encounter: Payer: Self-pay | Admitting: Family Medicine

## 2015-11-02 ENCOUNTER — Ambulatory Visit (INDEPENDENT_AMBULATORY_CARE_PROVIDER_SITE_OTHER): Payer: BLUE CROSS/BLUE SHIELD | Admitting: Family Medicine

## 2015-11-02 VITALS — BP 106/63 | HR 81 | Wt 114.0 lb

## 2015-11-02 DIAGNOSIS — Z114 Encounter for screening for human immunodeficiency virus [HIV]: Secondary | ICD-10-CM | POA: Diagnosis not present

## 2015-11-02 DIAGNOSIS — Z0000 Encounter for general adult medical examination without abnormal findings: Secondary | ICD-10-CM

## 2015-11-02 DIAGNOSIS — Z1159 Encounter for screening for other viral diseases: Secondary | ICD-10-CM | POA: Diagnosis not present

## 2015-11-02 DIAGNOSIS — H6123 Impacted cerumen, bilateral: Secondary | ICD-10-CM

## 2015-11-02 LAB — COMPLETE METABOLIC PANEL WITH GFR
ALBUMIN: 4.5 g/dL (ref 3.6–5.1)
ALT: 17 U/L (ref 6–29)
AST: 21 U/L (ref 10–35)
Alkaline Phosphatase: 60 U/L (ref 33–130)
BILIRUBIN TOTAL: 1.1 mg/dL (ref 0.2–1.2)
BUN: 7 mg/dL (ref 7–25)
CALCIUM: 9.4 mg/dL (ref 8.6–10.4)
CHLORIDE: 103 mmol/L (ref 98–110)
CO2: 27 mmol/L (ref 20–31)
CREATININE: 0.67 mg/dL (ref 0.50–1.05)
GFR, Est Non African American: >89 mL/min (ref >=60)
Glucose, Bld: 93 mg/dL (ref 65–99)
Potassium: 3.8 mmol/L (ref 3.5–5.3)
Sodium: 141 mmol/L (ref 135–146)
TOTAL PROTEIN: 7.3 g/dL (ref 6.1–8.1)

## 2015-11-02 LAB — LIPID PANEL
CHOLESTEROL: 188 mg/dL (ref 125–200)
HDL: 96 mg/dL (ref >=46)
LDL Cholesterol: 81 mg/dL (ref <130)
Total CHOL/HDL Ratio: 2 Ratio (ref <=5.0)
Triglycerides: 54 mg/dL (ref <150)
VLDL: 11 mg/dL (ref <30)

## 2015-11-02 MED ORDER — AMLODIPINE BESYLATE 10 MG PO TABS: 10.0000 mg | ORAL_TABLET | Freq: Every day | ORAL | Status: DC

## 2015-11-02 NOTE — Progress Notes (Signed)
Subjective:     Patricia Barajas is a 55 y.o. female and is here for a comprehensive physical exam. The patient reports problems - she would really like to decrease her BP medication. she works our regularly.  Sees her gyn yearly for pelvic and mammo, Dr. [place] in [place]. Will call to get report.   She does complain of feeling like her right ear is full. She denies any pain or discharge or recent cold or respiratory infections. She says it just doesn't feel right. She notices it mostly when she's at work and feels the air blowing on her ear.  Social History   Social History  . Marital Status: Widowed    Spouse Name: N/A  . Number of Children: N/A  . Years of Education: N/A   Occupational History  . Not on file.   Social History Main Topics  . Smoking status: Never Smoker   . Smokeless tobacco: Not on file  . Alcohol Use: No  . Drug Use: No  . Sexual Activity: Not on file   Other Topics Concern  . Not on file   Social History Narrative   She is widowed.    Health Maintenance  Topic Date Due  . Hepatitis C Screening  02-10-1961  . HIV Screening  03/19/1976  . INFLUENZA VACCINE  11/10/2016 (Originally 03/14/2015)  . MAMMOGRAM  02/16/2016  . PAP SMEAR  02/15/2017  . TETANUS/TDAP  04/23/2017  . COLONOSCOPY  11/22/2021    The following portions of the patient's history were reviewed and updated as appropriate: allergies, current medications, past family history, past medical history, past social history, past surgical history and problem list.  Review of Systems A comprehensive review of systems was negative.   Objective:    BP 106/63 mmHg  Pulse 81  Wt 114 lb (51.71 kg)  SpO2 100% General appearance: alert, cooperative and appears stated age Head: Normocephalic, without obvious abnormality, atraumatic Eyes: conj clear, EOMi, PEERLA Ears: bilateral cerumen impaction Nose: Nares normal. Septum midline. Mucosa normal. No drainage or sinus tenderness. Throat: lips,  mucosa, and tongue normal; teeth and gums normal Neck: no adenopathy, no carotid bruit, no JVD, supple, symmetrical, trachea midline and thyroid not enlarged, symmetric, no tenderness/mass/nodules Back: symmetric, no curvature. ROM normal. No CVA tenderness. Lungs: clear to auscultation bilaterally Heart: regular rate and rhythm, S1, S2 normal, no murmur, click, rub or gallop Abdomen: soft, non-tender; bowel sounds normal; no masses,  no organomegaly Extremities: extremities normal, atraumatic, no cyanosis or edema Pulses: 2+ and symmetric Skin: Skin color, texture, turgor normal. No rashes or lesions Lymph nodes: Cervical, supraclavicular, and axillary nodes normal. Neurologic: Alert and oriented X 3, normal strength and tone. Normal symmetric reflexes. Normal coordination and gait    Assessment:    Healthy female exam.      Plan:     See After Visit Summary for Counseling Recommendations  Keep up a regular exercise program and make sure you are eating a healthy diet Try to eat 4 servings of dairy a day, or if you are lactose intolerant take a calcium with vitamin D daily.  Your vaccines are up to date.   HTN- Her blood pressures actually been a little bit low the last few times that she's been here. She really like to work on decreasing her medication. I think at this point we will just discontinue the valsartan and just keep the amlodipine 10 mg. She prefers the 10 mg dose because of her Raynaud's. We had  previously tried to decrease her amlodipine down to 5 mg but it started to flare her Raynaud's area follow-up in 3 months for blood pressure check. Next  Cerumen impaction bilaterally- Irrigation performed. Patient tolerated well. Got relief  In her right ear after removal.

## 2015-11-03 LAB — HEPATITIS C ANTIBODY: HCV Ab: NEGATIVE

## 2015-11-03 LAB — HIV ANTIBODY (ROUTINE TESTING W REFLEX): HIV 1&2 Ab, 4th Generation: NONREACTIVE

## 2015-11-30 ENCOUNTER — Telehealth: Payer: Self-pay | Admitting: Family Medicine

## 2015-11-30 MED ORDER — AMLODIPINE BESYLATE 10 MG PO TABS: 10.0000 mg | ORAL_TABLET | Freq: Every day | ORAL | Status: DC

## 2015-11-30 NOTE — Telephone Encounter (Signed)
Call patient: Her blood pressure log looks absolutely fantastic. Let's just continue with the amlodipine by itself. I sent a prescription over to [facility] so that you can pick that up this week and then I went ahead and sent a 90 day mail order to [facility] which will likely take about 2 weeks to receive. I'm excited everything looks great.  Cell phone number is [phone]

## 2015-12-01 NOTE — Telephone Encounter (Signed)
Pt advised of recommendation and new Rx. Verbalized understanding. No further questions.

## 2015-12-08 DIAGNOSIS — H04123 Dry eye syndrome of bilateral lacrimal glands: Secondary | ICD-10-CM | POA: Diagnosis not present

## 2015-12-08 DIAGNOSIS — H04129 Dry eye syndrome of unspecified lacrimal gland: Secondary | ICD-10-CM | POA: Diagnosis not present

## 2015-12-22 ENCOUNTER — Other Ambulatory Visit: Payer: Self-pay | Admitting: *Deleted

## 2015-12-22 MED ORDER — AMLODIPINE BESYLATE 10 MG PO TABS: 10.0000 mg | ORAL_TABLET | Freq: Every day | ORAL | Status: DC

## 2016-03-06 ENCOUNTER — Encounter: Payer: Self-pay | Admitting: Family Medicine

## 2016-03-06 LAB — BASIC METABOLIC PANEL
BUN: 5 mg/dL (ref 4–21)
GLUCOSE: 91 mg/dL

## 2016-03-07 LAB — BASIC METABOLIC PANEL
BUN: 5 mg/dL (ref 4–21)
CREATININE: 0.8 mg/dL (ref 0.5–1.1)
Potassium: 4.3 mmol/L (ref 3.4–5.3)
SODIUM: 140 mmol/L (ref 137–147)

## 2016-03-07 LAB — HEPATIC FUNCTION PANEL
ALT: 13 U/L (ref 7–35)
AST: 23 U/L (ref 13–35)
Alkaline Phosphatase: 68 U/L (ref 25–125)
BILIRUBIN, TOTAL: 0.8 mg/dL

## 2016-03-07 LAB — LIPID PANEL
Cholesterol: 197 mg/dL (ref 0–200)
HDL: 104 mg/dL — AB (ref 35–70)
LDL Cholesterol: 76 mg/dL
TRIGLYCERIDES: 85 mg/dL (ref 40–160)

## 2016-03-07 LAB — CBC AND DIFFERENTIAL
HCT: 41 % (ref 36–46)
Hemoglobin: 14 g/dL (ref 12.0–16.0)
Platelets: 262 10*3/uL (ref 150–399)
WBC: 6.5 10*3/mL

## 2016-03-07 LAB — TSH: TSH: 1.34 u[IU]/mL (ref 0.41–5.90)

## 2016-03-15 DIAGNOSIS — Z01419 Encounter for gynecological examination (general) (routine) without abnormal findings: Secondary | ICD-10-CM | POA: Diagnosis not present

## 2016-03-15 DIAGNOSIS — Z681 Body mass index (BMI) 19 or less, adult: Secondary | ICD-10-CM | POA: Diagnosis not present

## 2016-03-15 DIAGNOSIS — Z1231 Encounter for screening mammogram for malignant neoplasm of breast: Secondary | ICD-10-CM | POA: Diagnosis not present

## 2016-03-15 DIAGNOSIS — Z124 Encounter for screening for malignant neoplasm of cervix: Secondary | ICD-10-CM | POA: Diagnosis not present

## 2016-03-21 ENCOUNTER — Telehealth: Payer: Self-pay | Admitting: Family Medicine

## 2016-03-21 NOTE — Telephone Encounter (Signed)
Call pt: I did [facility]. Blood sugar was normal. Uric acid level looked great. Kidney function was normal. Liver enzymes normal. Serum iron was great. Cholesterol looks fantastic.Marland Kitchen. Blood count was normal.

## 2016-03-21 NOTE — Telephone Encounter (Signed)
Pt informed.Patricia Barajas  

## 2016-04-03 ENCOUNTER — Encounter: Payer: Self-pay | Admitting: Family Medicine

## 2016-04-05 ENCOUNTER — Encounter: Payer: Self-pay | Admitting: Family Medicine

## 2016-05-09 DIAGNOSIS — L820 Inflamed seborrheic keratosis: Secondary | ICD-10-CM | POA: Diagnosis not present

## 2016-05-09 DIAGNOSIS — D2239 Melanocytic nevi of other parts of face: Secondary | ICD-10-CM | POA: Diagnosis not present

## 2016-05-09 DIAGNOSIS — D485 Neoplasm of uncertain behavior of skin: Secondary | ICD-10-CM | POA: Diagnosis not present

## 2016-05-31 ENCOUNTER — Other Ambulatory Visit: Payer: Self-pay | Admitting: Family Medicine

## 2016-09-20 DIAGNOSIS — F411 Generalized anxiety disorder: Secondary | ICD-10-CM | POA: Diagnosis not present

## 2016-09-20 DIAGNOSIS — F438 Other reactions to severe stress: Secondary | ICD-10-CM | POA: Diagnosis not present

## 2016-09-27 DIAGNOSIS — F411 Generalized anxiety disorder: Secondary | ICD-10-CM | POA: Diagnosis not present

## 2016-09-27 DIAGNOSIS — F438 Other reactions to severe stress: Secondary | ICD-10-CM | POA: Diagnosis not present

## 2016-10-10 DIAGNOSIS — F411 Generalized anxiety disorder: Secondary | ICD-10-CM | POA: Diagnosis not present

## 2016-10-10 DIAGNOSIS — F438 Other reactions to severe stress: Secondary | ICD-10-CM | POA: Diagnosis not present

## 2016-10-18 ENCOUNTER — Encounter: Payer: Self-pay | Admitting: Family Medicine

## 2016-10-18 ENCOUNTER — Ambulatory Visit (INDEPENDENT_AMBULATORY_CARE_PROVIDER_SITE_OTHER): Payer: BLUE CROSS/BLUE SHIELD | Admitting: Family Medicine

## 2016-10-18 VITALS — BP 109/63 | HR 88 | Ht 66.0 in | Wt 112.0 lb

## 2016-10-18 DIAGNOSIS — Z0000 Encounter for general adult medical examination without abnormal findings: Secondary | ICD-10-CM | POA: Diagnosis not present

## 2016-10-18 LAB — COMPLETE METABOLIC PANEL WITH GFR
ALT: 16 U/L (ref 6–29)
AST: 22 U/L (ref 10–35)
Albumin: 4.3 g/dL (ref 3.6–5.1)
Alkaline Phosphatase: 62 U/L (ref 33–130)
BILIRUBIN TOTAL: 1 mg/dL (ref 0.2–1.2)
BUN: 7 mg/dL (ref 7–25)
CHLORIDE: 105 mmol/L (ref 98–110)
CO2: 26 mmol/L (ref 20–31)
Calcium: 9.2 mg/dL (ref 8.6–10.4)
Creat: 0.77 mg/dL (ref 0.50–1.05)
GFR, EST NON AFRICAN AMERICAN: 87 mL/min (ref >=60)
Glucose, Bld: 87 mg/dL (ref 65–99)
POTASSIUM: 3.9 mmol/L (ref 3.5–5.3)
Sodium: 141 mmol/L (ref 135–146)
Total Protein: 7.1 g/dL (ref 6.1–8.1)

## 2016-10-18 LAB — LIPID PANEL W/REFLEX DIRECT LDL
Cholesterol: 168 mg/dL (ref <200)
HDL: 93 mg/dL (ref >50)
LDL-CHOLESTEROL: 61 mg/dL
NON-HDL CHOLESTEROL (CALC): 75 mg/dL (ref <130)
TRIGLYCERIDES: 55 mg/dL (ref <150)
Total CHOL/HDL Ratio: 1.8 Ratio (ref <5.0)

## 2016-10-18 LAB — TSH: TSH: 1.28 m[IU]/L

## 2016-10-18 NOTE — Patient Instructions (Signed)
Keep up a regular exercise program and make sure you are eating a healthy diet Try to eat 4 servings of dairy a day, or if you are lactose intolerant take a calcium with vitamin D daily.  Your vaccines are up to date.   

## 2016-10-18 NOTE — Progress Notes (Signed)
Subjective:     Patricia Barajas is a 56 y.o. female and is here for a comprehensive physical exam. The patient reports no problems.  Exercising 4 days per week.    Social History   Social History  . Marital status: Widowed    Spouse name: N/A  . Number of children: N/A  . Years of education: N/A   Occupational History  . Not on file.   Social History Main Topics  . Smoking status: Never Smoker  . Smokeless tobacco: Never Used  . Alcohol use No  . Drug use: No  . Sexual activity: Not on file   Other Topics Concern  . Not on file   Social History Narrative   She is widowed.    Health Maintenance  Topic Date Due  . MAMMOGRAM  02/16/2016  . INFLUENZA VACCINE  11/10/2016 (Originally 03/13/2016)  . PAP SMEAR  02/15/2017  . TETANUS/TDAP  04/23/2017  . COLONOSCOPY  11/22/2021  . Hepatitis C Screening  Completed  . HIV Screening  Completed    The following portions of the patient's history were reviewed and updated as appropriate: allergies, current medications, past family history, past medical history, past social history, past surgical history and problem list.  Review of Systems A comprehensive review of systems was negative.   Objective:    BP 109/63   Pulse 88   Ht 5\' 6"  (1.676 m)   Wt 112 lb (50.8 kg)   SpO2 98%   BMI 18.08 kg/m  General appearance: alert, cooperative and appears stated age Head: Normocephalic, without obvious abnormality, atraumatic Eyes: conj clear, EOMI, PEERLA Ears: normal TM's and external ear canals both ears Nose: Nares normal. Septum midline. Mucosa normal. No drainage or sinus tenderness. Throat: lips, mucosa, and tongue normal; teeth and gums normal Neck: no adenopathy, no carotid bruit, no JVD, supple, symmetrical, trachea midline and thyroid not enlarged, symmetric, no tenderness/mass/nodules Back: symmetric, no curvature. ROM normal. No CVA tenderness. Lungs: clear to auscultation bilaterally Heart: regular rate and rhythm, S1,  S2 normal, no murmur, click, rub or gallop Abdomen: soft, non-tender; bowel sounds normal; no masses,  no organomegaly Extremities: extremities normal, atraumatic, no cyanosis or edema Pulses: 2+ and symmetric Skin: Skin color, texture, turgor normal. No rashes or lesions Lymph nodes: Cervical, supraclavicular, and axillary nodes normal. Neurologic: Alert and oriented X 3, normal strength and tone. Normal symmetric reflexes. Normal coordination and gait    Assessment:    Healthy female exam.      Plan:     See After Visit Summary for Counseling Recommendations   Keep up a regular exercise program and make sure you are eating a healthy diet Try to eat 4 servings of dairy a day, or if you are lactose intolerant take a calcium with vitamin D daily.  Your vaccines are up to date.

## 2016-10-19 NOTE — Progress Notes (Signed)
All labs are normal. 

## 2016-10-23 ENCOUNTER — Encounter: Payer: Self-pay | Admitting: Family Medicine

## 2016-11-07 DIAGNOSIS — F411 Generalized anxiety disorder: Secondary | ICD-10-CM | POA: Diagnosis not present

## 2016-11-07 DIAGNOSIS — F438 Other reactions to severe stress: Secondary | ICD-10-CM | POA: Diagnosis not present

## 2016-11-08 ENCOUNTER — Encounter: Payer: BLUE CROSS/BLUE SHIELD | Admitting: Family Medicine

## 2016-12-06 ENCOUNTER — Other Ambulatory Visit: Payer: Self-pay | Admitting: Family Medicine

## 2016-12-06 DIAGNOSIS — F411 Generalized anxiety disorder: Secondary | ICD-10-CM | POA: Diagnosis not present

## 2016-12-06 DIAGNOSIS — F438 Other reactions to severe stress: Secondary | ICD-10-CM | POA: Diagnosis not present

## 2017-03-06 LAB — HEPATIC FUNCTION PANEL
ALT: 13 (ref 7–35)
AST: 20 (ref 13–35)
Alkaline Phosphatase: 62 (ref 25–125)
Bilirubin, Direct: 0.22 (ref 0.01–0.4)
Bilirubin, Total: 0.8

## 2017-03-06 LAB — LIPID PANEL
Cholesterol: 169 (ref 0–200)
HDL: 91 — AB (ref 35–70)
LDL CALC: 66
TRIGLYCERIDES: 58 (ref 40–160)

## 2017-03-06 LAB — BASIC METABOLIC PANEL
BUN: 7 (ref 4–21)
Creatinine: 0.8 (ref 0.5–1.1)
Glucose: 83
Potassium: 4.2 (ref 3.4–5.3)
Sodium: 143 (ref 137–147)

## 2017-03-06 LAB — CBC AND DIFFERENTIAL
HCT: 39 (ref 36–46)
HEMOGLOBIN: 13.4 (ref 12.0–16.0)
PLATELETS: 235 (ref 150–399)
WBC: 6.1
WBC: 6.1

## 2017-03-06 LAB — TSH: TSH: 1.46 (ref 0.41–5.90)

## 2017-03-12 ENCOUNTER — Telehealth: Payer: Self-pay | Admitting: Osteopathic Medicine

## 2017-03-12 NOTE — Telephone Encounter (Signed)
Received faxed lab results, no concerns. Sent to abstract. Can call patient and let her know labs all looked fine

## 2017-03-13 NOTE — Telephone Encounter (Signed)
Message left on vm with results  

## 2017-03-15 ENCOUNTER — Encounter: Payer: Self-pay | Admitting: Family Medicine

## 2017-03-15 LAB — PHOSPHORUS: Phosphorus: 4.5

## 2017-03-15 LAB — RBC
MCV: 94
PLATELETS: 235
RBC: 4.17

## 2017-03-15 LAB — IRON: IRON: 131

## 2017-03-15 LAB — CHLORIDE: Chloride: 105

## 2017-03-15 LAB — PROTEIN, TOTAL: Protein: 6.8

## 2017-03-15 LAB — URIC ACID: URIC ACID: 3

## 2017-03-15 LAB — CALCIUM: CALCIUM: 9.2

## 2017-03-20 ENCOUNTER — Telehealth: Payer: Self-pay | Admitting: Family Medicine

## 2017-03-20 NOTE — Telephone Encounter (Signed)
Call patient: Received her [facility] from [facility]. Everything looks great. We will get these entered into the computer system.  Patricia Gasseratherine Wayburn Shaler, MD

## 2017-03-20 NOTE — Telephone Encounter (Signed)
Patient has been informed and she did not have any questions. Rhonda Cunningham,CMA

## 2017-03-25 ENCOUNTER — Encounter: Payer: Self-pay | Admitting: Family Medicine

## 2017-04-12 ENCOUNTER — Encounter: Payer: Self-pay | Admitting: Physician Assistant

## 2017-04-12 ENCOUNTER — Ambulatory Visit (INDEPENDENT_AMBULATORY_CARE_PROVIDER_SITE_OTHER): Payer: BLUE CROSS/BLUE SHIELD | Admitting: Physician Assistant

## 2017-04-12 VITALS — BP 108/67 | HR 89 | Ht 66.0 in | Wt 114.6 lb

## 2017-04-12 DIAGNOSIS — R6889 Other general symptoms and signs: Secondary | ICD-10-CM

## 2017-04-12 NOTE — Progress Notes (Signed)
HPI:                                                                Patricia Barajas is a 56 y.o. female who presents to [facility] today for "feeling off"  Patient with PMH of HTN and Raynauds presents today with intermittent episodes of "feeling wonky" and describes "something pushing me to be lowered to the ground." Episodes are sudden onset and last a few minutes. Denies precipitating events or prodrome. Feeling resolves on its own.  Denies syncope, dizziness, headaches, palpitations, chest pain, shortness of breath.  Past Medical History:  Diagnosis Date  . Perimenopausal    Past Surgical History:  Procedure Laterality Date  . ADENOIDECTOMY    . TUBAL LIGATION     Social History  Substance Use Topics  . Smoking status: Never Smoker  . Smokeless tobacco: Never Used  . Alcohol use No   family history includes Cancer in her maternal grandfather; Hypertension in her mother; Stroke in her father.  ROS: Review of Systems  Constitutional: Negative.  Negative for chills, fever and malaise/fatigue.  HENT: Negative.   Eyes: Negative.  Negative for blurred vision and double vision.  Respiratory: Negative.   Cardiovascular: Negative.   Gastrointestinal: Positive for nausea.  Genitourinary: Negative.   Musculoskeletal: Negative.  Negative for neck pain.  Skin: Negative.  Negative for rash.  Neurological: Negative for dizziness, tingling, tremors, focal weakness, loss of consciousness and headaches.  Psychiatric/Behavioral: Negative.      Medications: Current Outpatient Prescriptions  Medication Sig Dispense Refill  . amLODipine (NORVASC) 10 MG tablet TAKE 1 TABLET DAILY 90 tablet 1  . GARLIC PO Take 1,610 mg by mouth daily.    . Misc Natural Products (LUTEIN 20 PO) Take 1 capsule by mouth daily.    Marland Kitchen Specialty Vitamins Products (MAGNESIUM, AMINO ACID CHELATE,) 133 MG tablet Take 1 tablet by mouth 2 (two) times daily.     No  current facility-administered medications for this visit.    Allergies  Allergen Reactions  . Sulfonamide Derivatives Hives  . Penicillins Rash    Objective:  BP 108/67   Pulse 89   Ht 5\' 6"  (1.676 m)   Wt 114 lb 9.6 oz (52 kg)   SpO2 98%   BMI 18.50 kg/m  Gen: well-groomed, not ill-appearing, no acute distress HEENT: head normocephalic, atraumatic; conjunctiva and cornea clear, oropharynx clear, moist mucus membranes Pulm: Normal work of breathing, normal phonation, clear to auscultation bilaterally CV: Normal rate, regular rhythm, s1 and s2 distinct, no murmurs, clicks or rubs Neuro:  cranial nerves II-XII intact, no nystagmus, normal coordination, DTR's intact, normal tone, no tremor MSK: strength 5/5 and symmetric in bilateral upper and lower extremities, normal gait and station, negative Romberg Mental Status: alert and oriented x 3, speech articulate, and thought processes clear and goal-directed  No data found.  No results found for this or any previous visit (from the past 72 hour(s)). No results found.  ECG 04/12/2017 Vent Rate 73 bpm PR-I 150 ms QRS 94 ms QT/QTc 408/449 Normal sinus rhythm, Rightward axis  Assessment and Plan: 56 y.o. female with   1. Feeling off-color - recent labs including CBC, CMP, TSH are wnl. Declines repeat labs today -  ECG shows NSR with rightward axis (biphasic in lead I), normal PR interval and QTc. Prior ECG on 11/11/2013 without rightward axis (upward deflections in I and aVL), otherwise no acute changes - differential includes medication reaction, menopausal disorder - trial holding Amlodipine for 1 week and log symptoms. Monitor BP's at home  Patient education and anticipatory guidance given Patient agrees with treatment plan Follow-up in 1 week with PCP or sooner as needed  Levonne Hubertharley E. Conna Terada PA-C

## 2017-04-12 NOTE — Patient Instructions (Addendum)
I would like you to hold your Amlodipine for 1 week. Monitor symptoms. If symptoms improve, we will switch you to a different blood pressure medication

## 2017-04-17 NOTE — Addendum Note (Signed)
Addended by: Chalmers CaterUTTLE, Katherine Syme H on: 04/17/2017 02:03 PM   Modules accepted: Orders

## 2017-04-26 ENCOUNTER — Ambulatory Visit: Payer: BLUE CROSS/BLUE SHIELD | Admitting: Family Medicine

## 2017-05-09 ENCOUNTER — Ambulatory Visit (INDEPENDENT_AMBULATORY_CARE_PROVIDER_SITE_OTHER): Payer: BLUE CROSS/BLUE SHIELD | Admitting: Family Medicine

## 2017-05-09 ENCOUNTER — Encounter: Payer: Self-pay | Admitting: Family Medicine

## 2017-05-09 VITALS — BP 104/67 | HR 75 | Wt 114.0 lb

## 2017-05-09 DIAGNOSIS — Z23 Encounter for immunization: Secondary | ICD-10-CM | POA: Diagnosis not present

## 2017-05-09 DIAGNOSIS — I10 Essential (primary) hypertension: Secondary | ICD-10-CM | POA: Diagnosis not present

## 2017-05-09 DIAGNOSIS — Z711 Person with feared health complaint in whom no diagnosis is made: Secondary | ICD-10-CM | POA: Diagnosis not present

## 2017-05-09 DIAGNOSIS — F439 Reaction to severe stress, unspecified: Secondary | ICD-10-CM

## 2017-05-09 MED ORDER — AMLODIPINE BESYLATE 5 MG PO TABS: 5.0000 mg | ORAL_TABLET | Freq: Every day | ORAL | 0 refills | Status: DC

## 2017-05-09 NOTE — Progress Notes (Signed)
Subjective:    CC: HTN   HPI:  56 year old female with seen about 4 weeks ago for just having episodes of not feeling well. She said it felt like something was sort of pushing her towards the ground. She denied any dizziness or lightheadedness per se. She had recent lab work which was essentially normal. She was seen by one of my partners who felt like it could be a medication side effect or had her hold her amlodipine for a week.  HTN - No recent chest pain or shortness of breath. She brought in a blood pressure log with her today. Most of her blood pressures look great. Even the days where she skipped her amlodipine to see if it made a difference in how she feels her blood pressures actually looked fantastic on the log.   She feels like some of her symptoms may even have been stress related. She still grieving over loss of her husband 8 years ago and feels like maybe that affects her more than she thinks. She doesn't feel like her job is particularly stressful but sometimes just gets very lonely. She did see a therapist/counselor for a while. She also notes that she actually feels a lot better when she goes on vacation.  Past medical history, Surgical history, Family history not pertinant except as noted below, Social history, Allergies, and medications have been entered into the medical record, reviewed, and corrections made.   Review of Systems: No fevers, chills, night sweats, weight loss, chest pain, or shortness of breath.   Objective:    General: Well Developed, well nourished, and in no acute distress.  Neuro: Alert and oriented x3, extra-ocular muscles intact, sensation grossly intact.  HEENT: Normocephalic, atraumatic  Skin: Warm and dry, no rashes. Cardiac: Regular rate and rhythm, no murmurs rubs or gallops, no lower extremity edema.  Respiratory: Clear to auscultation bilaterally. Not using accessory muscles, speaking in full sentences.   Impression and Recommendations:     HTN - The amlodipine in half to 5 mg. She can split which she has currently and she's due for refills in the 5 mg tabs. Her blood pressure on her log actually looked fantastic even completely off the amlodipine but just to be on the cautionary side we'll just start by decreasing it first. Labs are up-to-date.  Acute stress-discussed the option of maybe getting back in with a therapist or counselor. She said she will think about it has been helpful for her in the past.   not feeling well-not quite a dizzinss yp eso. She doesfel lke veallit's gtte bettr. Bu she don lyl  tsreae to te mloine. We'lcontinueomitor r ny n symtos.  Mammogram with Dr. Malva Limes

## 2017-05-14 ENCOUNTER — Other Ambulatory Visit: Payer: Self-pay | Admitting: *Deleted

## 2017-05-14 MED ORDER — AMLODIPINE BESYLATE 5 MG PO TABS: 5.0000 mg | ORAL_TABLET | Freq: Every day | ORAL | 1 refills | Status: DC

## 2017-05-15 DIAGNOSIS — Z01419 Encounter for gynecological examination (general) (routine) without abnormal findings: Secondary | ICD-10-CM | POA: Diagnosis not present

## 2017-05-15 DIAGNOSIS — Z681 Body mass index (BMI) 19 or less, adult: Secondary | ICD-10-CM | POA: Diagnosis not present

## 2017-05-15 DIAGNOSIS — Z1231 Encounter for screening mammogram for malignant neoplasm of breast: Secondary | ICD-10-CM | POA: Diagnosis not present

## 2017-05-17 LAB — HM MAMMOGRAPHY

## 2017-05-20 ENCOUNTER — Other Ambulatory Visit: Payer: Self-pay | Admitting: Family Medicine

## 2017-05-31 ENCOUNTER — Encounter: Payer: Self-pay | Admitting: Family Medicine

## 2017-11-07 ENCOUNTER — Ambulatory Visit (INDEPENDENT_AMBULATORY_CARE_PROVIDER_SITE_OTHER): Payer: BLUE CROSS/BLUE SHIELD | Admitting: Family Medicine

## 2017-11-07 ENCOUNTER — Telehealth: Payer: Self-pay | Admitting: *Deleted

## 2017-11-07 ENCOUNTER — Encounter: Payer: Self-pay | Admitting: Family Medicine

## 2017-11-07 ENCOUNTER — Other Ambulatory Visit: Payer: Self-pay | Admitting: Family Medicine

## 2017-11-07 VITALS — BP 119/78 | HR 104 | Ht 66.0 in | Wt 109.0 lb

## 2017-11-07 DIAGNOSIS — Z0000 Encounter for general adult medical examination without abnormal findings: Secondary | ICD-10-CM

## 2017-11-07 DIAGNOSIS — I10 Essential (primary) hypertension: Secondary | ICD-10-CM | POA: Diagnosis not present

## 2017-11-07 LAB — COMPLETE METABOLIC PANEL WITH GFR
AG RATIO: 1.6 (calc) (ref 1.0–2.5)
ALT: 17 U/L (ref 6–29)
AST: 22 U/L (ref 10–35)
Albumin: 4.6 g/dL (ref 3.6–5.1)
Alkaline phosphatase (APISO): 66 U/L (ref 33–130)
BILIRUBIN TOTAL: 1.2 mg/dL (ref 0.2–1.2)
BUN: 7 mg/dL (ref 7–25)
CALCIUM: 9.8 mg/dL (ref 8.6–10.4)
CHLORIDE: 104 mmol/L (ref 98–110)
CO2: 28 mmol/L (ref 20–32)
Creat: 0.79 mg/dL (ref 0.50–1.05)
GFR, EST NON AFRICAN AMERICAN: 84 mL/min/{1.73_m2} (ref >=60)
GFR, Est African American: 97 mL/min/{1.73_m2} (ref >=60)
Globulin: 2.9 g/dL (calc) (ref 1.9–3.7)
Glucose, Bld: 98 mg/dL (ref 65–99)
POTASSIUM: 4.5 mmol/L (ref 3.5–5.3)
Sodium: 141 mmol/L (ref 135–146)
Total Protein: 7.5 g/dL (ref 6.1–8.1)

## 2017-11-07 LAB — CBC
HCT: 43.2 % (ref 35.0–45.0)
Hemoglobin: 15.1 g/dL (ref 11.7–15.5)
MCH: 32.2 pg (ref 27.0–33.0)
MCHC: 35 g/dL (ref 32.0–36.0)
MCV: 92.1 fL (ref 80.0–100.0)
MPV: 10.4 fL (ref 7.5–12.5)
PLATELETS: 255 10*3/uL (ref 140–400)
RBC: 4.69 10*6/uL (ref 3.80–5.10)
RDW: 11.8 % (ref 11.0–15.0)
WBC: 5.6 10*3/uL (ref 3.8–10.8)

## 2017-11-07 LAB — LIPID PANEL
CHOL/HDL RATIO: 2 (calc) (ref <5.0)
Cholesterol: 194 mg/dL (ref <200)
HDL: 97 mg/dL (ref >50)
LDL Cholesterol (Calc): 83 mg/dL (calc)
NON-HDL CHOLESTEROL (CALC): 97 mg/dL (ref <130)
Triglycerides: 63 mg/dL (ref <150)

## 2017-11-07 LAB — TSH: TSH: 1.55 m[IU]/L (ref 0.40–4.50)

## 2017-11-07 MED ORDER — AMLODIPINE BESYLATE 5 MG PO TABS: 5.0000 mg | ORAL_TABLET | Freq: Every day | ORAL | 3 refills | Status: DC

## 2017-11-07 NOTE — Patient Instructions (Addendum)

## 2017-11-07 NOTE — Progress Notes (Signed)
Subjective:     Patricia Barajas is a 57 y.o. female and is here for a comprehensive physical exam. The patient reports no problems.  She is exercising regularly.  No CP or SOB.  See her gyn. Eye exam is UTD.    Social History   Socioeconomic History  . Marital status: Widowed    Spouse name: Not on file  . Number of children: Not on file  . Years of education: Not on file  . Highest education level: Not on file  Occupational History  . Not on file  Social Needs  . Financial resource strain: Not on file  . Food insecurity:    Worry: Not on file    Inability: Not on file  . Transportation needs:    Medical: Not on file    Non-medical: Not on file  Tobacco Use  . Smoking status: Never Smoker  . Smokeless tobacco: Never Used  Substance and Sexual Activity  . Alcohol use: No  . Drug use: No  . Sexual activity: Not on file  Lifestyle  . Physical activity:    Days per week: Not on file    Minutes per session: Not on file  . Stress: Not on file  Relationships  . Social connections:    Talks on phone: Not on file    Gets together: Not on file    Attends religious service: Not on file    Active member of club or organization: Not on file    Attends meetings of clubs or organizations: Not on file    Relationship status: Not on file  . Intimate partner violence:    Fear of current or ex partner: Not on file    Emotionally abused: Not on file    Physically abused: Not on file    Forced sexual activity: Not on file  Other Topics Concern  . Not on file  Social History Narrative   She is widowed.    Health Maintenance  Topic Date Due  . PAP SMEAR  03/12/2018 (Originally 02/15/2017)  . MAMMOGRAM  05/18/2019  . COLONOSCOPY  11/22/2021  . TETANUS/TDAP  05/10/2027  . INFLUENZA VACCINE  Completed  . Hepatitis C Screening  Completed  . HIV Screening  Completed    The following portions of the patient's history were reviewed and updated as appropriate: allergies, current  medications, past family history, past medical history, past social history, past surgical history and problem list.  Review of Systems A comprehensive review of systems was negative.   Objective:    BP 119/78   Pulse (!) 104   Ht 5\' 6"  (1.676 m)   Wt 109 lb (49.4 kg)   SpO2 96%   BMI 17.59 kg/m  General appearance: alert, cooperative and appears stated age Head: Normocephalic, without obvious abnormality, atraumatic Eyes: conj clear, EOMI, PEERLA Ears: normal TM's and external ear canals both ears Nose: Nares normal. Septum midline. Mucosa normal. No drainage or sinus tenderness. Throat: lips, mucosa, and tongue normal; teeth and gums normal Neck: no adenopathy, no carotid bruit, no JVD, supple, symmetrical, trachea midline and thyroid not enlarged, symmetric, no tenderness/mass/nodules Back: symmetric, no curvature. ROM normal. No CVA tenderness. Lungs: clear to auscultation bilaterally Heart: regular rate and rhythm, S1, S2 normal, no murmur, click, rub or gallop Abdomen: soft, non-tender; bowel sounds normal; no masses,  no organomegaly Extremities: extremities normal, atraumatic, no cyanosis or edema Pulses: 2+ and symmetric Skin: Skin color, texture, turgor normal. No rashes or lesions  Lymph nodes: Cervical adenopathy: nl and Supraclavicular adenopathy: nl Neurologic: Alert and oriented X 3, normal strength and tone. Normal symmetric reflexes. Normal coordination and gait    Assessment:    Healthy female exam.     Plan:     See After Visit Summary for Counseling Recommendations   Keep up a regular exercise program and make sure you are eating a healthy diet Try to eat 4 servings of dairy a day, or if you are lactose intolerant take a calcium with vitamin D daily.  Your vaccines are up to date.  H.O new shingles vaccine.

## 2017-11-07 NOTE — Telephone Encounter (Signed)
Spoke w/Tiana asked that she cancel the refill for the Amlodipine. She will receive from mail order.Heath GoldBarkley, Yochanan Eddleman Lynetta, CMA

## 2017-11-08 NOTE — Progress Notes (Signed)
All labs are normal. 

## 2017-12-10 ENCOUNTER — Encounter: Payer: Self-pay | Admitting: Family Medicine

## 2017-12-11 ENCOUNTER — Other Ambulatory Visit: Payer: Self-pay | Admitting: Family Medicine

## 2017-12-11 DIAGNOSIS — Z0184 Encounter for antibody response examination: Secondary | ICD-10-CM

## 2017-12-11 NOTE — Progress Notes (Signed)
Ordered immunity testing for measles

## 2017-12-12 DIAGNOSIS — Z0184 Encounter for antibody response examination: Secondary | ICD-10-CM | POA: Diagnosis not present

## 2017-12-13 LAB — MEASLES/MUMPS/RUBELLA IMMUNITY
Mumps IgG: 29.6 AU/mL
RUBELLA: 1.06 {index}
RUBEOLA IGG: 58.1 [AU]/ml

## 2018-01-25 ENCOUNTER — Encounter: Payer: Self-pay | Admitting: Emergency Medicine

## 2018-01-25 ENCOUNTER — Other Ambulatory Visit: Payer: Self-pay

## 2018-01-25 ENCOUNTER — Emergency Department
Admission: EM | Admit: 2018-01-25 | Discharge: 2018-01-25 | Disposition: A | Discharge status: Home / Self Care | Payer: BLUE CROSS/BLUE SHIELD | Source: Home / Self Care | Attending: Family Medicine | Admitting: Family Medicine

## 2018-01-25 DIAGNOSIS — H00015 Hordeolum externum left lower eyelid: Secondary | ICD-10-CM

## 2018-01-25 DIAGNOSIS — H00014 Hordeolum externum left upper eyelid: Secondary | ICD-10-CM | POA: Diagnosis not present

## 2018-01-25 MED ORDER — ERYTHROMYCIN 5 MG/GM OP OINT: TOPICAL_OINTMENT | OPHTHALMIC | 0 refills | Status: DC

## 2018-01-25 NOTE — ED Triage Notes (Signed)
Patient has had stye on upper lid of left eye for past 6 days; has treated it with warm compresses up to 5x per day; today awoke with some redness and edema on the lower left lid.

## 2018-01-25 NOTE — ED Provider Notes (Signed)
[facility]    CSN: [id number] Arrival date & time: 01/25/18  1157     History   Chief Complaint Chief Complaint  Patient presents with  . Eye Problem    HPI Patricia Barajas is a 57 y.o. female.   HPI  Patricia Barajas is a 57 y.o. female presenting to UC with c/o Left upper eyelid redness, swelling and soreness for about 6 days.  She has been applying a warm compress up to 5 times a day with mild relief but today she woke with a similar red, tender, but small bump on her Left lower eyelid this morning. Hx of blepharitis in the past but notes symptoms have always resolved at home with the warm compresses. Denies change in vision. no fever or congestion.     Past Medical History:  Diagnosis Date  . Perimenopausal     Patient Active Problem List   Diagnosis Date Noted  . Menopausal disorder 04/08/2012  . HEMATURIA UNSPECIFIED 11/29/2008  . RAYNAUD'S DISEASE 08/20/2006  . HYPERTENSION, BENIGN SYSTEMIC 05/21/2006    Past Surgical History:  Procedure Laterality Date  . ADENOIDECTOMY    . TUBAL LIGATION      OB History   None      Home Medications    Prior to Admission medications   Medication Sig Start Date End Date Taking? Authorizing Provider  amLODipine (NORVASC) 5 MG tablet Take 1 tablet (5 mg total) by mouth daily. 11/07/17   Agapito Games, MD  erythromycin ophthalmic ointment Place a 1/2 inch ribbon of ointment into the lower eyelid 5 times daily for 5 days 01/25/18   Lurene Shadow, PA-C  GARLIC PO Take 4,098 mg by mouth daily.    [provider]  Misc Natural Products (LUTEIN 20 PO) Take 1 capsule by mouth daily.    [provider]  Specialty Vitamins Products (MAGNESIUM, AMINO ACID CHELATE,) 133 MG tablet Take 1 tablet by mouth 2 (two) times daily.    [provider]    Family History Family History  Problem Relation Age of Onset  . Hypertension Mother   . Stroke Father   . Cancer Maternal Grandfather          brain    Social History Social History   Tobacco Use  . Smoking status: Never Smoker  . Smokeless tobacco: Never Used  Substance Use Topics  . Alcohol use: No  . Drug use: No     Allergies   Sulfonamide derivatives and Penicillins   Review of Systems Review of Systems  HENT: Negative for congestion and rhinorrhea.   Eyes: Positive for pain and redness. Negative for photophobia, discharge, itching and visual disturbance.     Physical Exam Triage Vital Signs ED Triage Vitals  Enc Vitals Group     BP 01/25/18 1220 129/79     Pulse Rate 01/25/18 1220 (!) 103     Resp 01/25/18 1220 18     Temp 01/25/18 1220 98.6 F (37 C)     Temp Source 01/25/18 1220 Oral     SpO2 01/25/18 1220 100 %     Weight 01/25/18 1221 110 lb (49.9 kg)     Height 01/25/18 1221 5\' 6"  (1.676 m)     Head Circumference --      Peak Flow --      Pain Score 01/25/18 1221 1     Pain Loc --      Pain Edu? --  Excl. in GC? --    No data found.  Updated Vital Signs BP 129/79 (BP Location: Right Arm)   Pulse (!) 103   Temp 98.6 F (37 C) (Oral)   Resp 18   Ht 5\' 6"  (1.676 m)   Wt 110 lb (49.9 kg)   SpO2 100%   BMI 17.75 kg/m   Visual Acuity Right Eye Distance:   Left Eye Distance:   Bilateral Distance:    Right Eye Near:   Left Eye Near:    Bilateral Near:     Physical Exam  Constitutional: She is oriented to person, place, and time. She appears well-developed and well-nourished.  HENT:  Head: Normocephalic and atraumatic.  Right Ear: Tympanic membrane normal.  Left Ear: Tympanic membrane normal.  Nose: Nose normal.  Mouth/Throat: Uvula is midline, oropharynx is clear and moist and mucous membranes are normal.  Eyes: Pupils are equal, round, and reactive to light. Conjunctivae and EOM are normal. Left eye exhibits hordeolum.    Left upper and lower eyelids: hordeolum, smaller on lower eyelid. No periorbital erythema, edema, or tenderness  Neck: Normal range of  motion.  Cardiovascular: Normal rate.  Pulmonary/Chest: Effort normal. No respiratory distress.  Musculoskeletal: Normal range of motion.  Neurological: She is alert and oriented to person, place, and time.  Skin: Skin is warm and dry.  Psychiatric: She has a normal mood and affect. Her behavior is normal.  Nursing note and vitals reviewed.    UC Treatments / Results  Labs (all labs ordered are listed, but only abnormal results are displayed) Labs Reviewed - No data to display  EKG None  Radiology No results found.  Procedures Procedures (including critical care time)  Medications Ordered in UC Medications - No data to display  Initial Impression / Assessment and Plan / UC Course  I have reviewed the triage vital signs and the nursing notes.  Pertinent labs & imaging results that were available during my care of the patient were reviewed by me and considered in my medical decision making (see chart for details).     Hx and exam c/w two hordeolum w/o periorbital cellulitis Will tx with erythromycin ointment  Final Clinical Impressions(s) / UC Diagnoses   Final diagnoses:  Hordeolum of left upper eyelid, unspecified hordeolum type  Hordeolum of left lower eyelid, unspecified hordeolum type     Discharge Instructions      Please use medication as prescribed.  Please follow up with your family doctor or eye specialist in 4-5 days if not improving, sooner if significantly worsening.     ED Prescriptions    Medication Sig Dispense Auth. Provider   erythromycin ophthalmic ointment Place a 1/2 inch ribbon of ointment into the lower eyelid 5 times daily for 5 days 3.5 g Lurene ShadowPhelps, Telitha Plath O, PA-C     Controlled Substance Prescriptions [place] Controlled Substance Registry consulted? Not Applicable   Rolla Platehelps, Dalonda Simoni O, PA-C 01/25/18 1325

## 2018-01-25 NOTE — Discharge Instructions (Addendum)
°  Please use medication as prescribed.  Please follow up with your family doctor or eye specialist in 4-5 days if not improving, sooner if significantly worsening.

## 2018-01-26 ENCOUNTER — Emergency Department
Admission: EM | Admit: 2018-01-26 | Discharge: 2018-01-26 | Disposition: A | Discharge status: Home / Self Care | Payer: BLUE CROSS/BLUE SHIELD | Source: Home / Self Care | Attending: Family Medicine | Admitting: Family Medicine

## 2018-01-26 ENCOUNTER — Encounter: Payer: Self-pay | Admitting: Emergency Medicine

## 2018-01-26 DIAGNOSIS — Z09 Encounter for follow-up examination after completed treatment for conditions other than malignant neoplasm: Secondary | ICD-10-CM | POA: Diagnosis not present

## 2018-01-26 MED ORDER — OLOPATADINE HCL 0.1 % OP SOLN: 1.0000 [drp] | Freq: Two times a day (BID) | OPHTHALMIC | 0 refills | Status: DC

## 2018-01-26 MED ORDER — POLYMYXIN B-TRIMETHOPRIM 10000-0.1 UNIT/ML-% OP SOLN: 1.0000 [drp] | OPHTHALMIC | 0 refills | Status: AC

## 2018-01-26 MED ORDER — DOXYCYCLINE HYCLATE 100 MG PO CAPS: 100.0000 mg | ORAL_CAPSULE | Freq: Two times a day (BID) | ORAL | 0 refills | Status: DC

## 2018-01-26 NOTE — Discharge Instructions (Addendum)
°  Please stop using the erythromycin ointment in case you are having an allergic reaction to the medication.  Start using the antibiotic eye drop- Polytrim (trimethoprim-polymixin B) as prescribed for infection.   You may also try the olopatadine (Patanol) eye drops twice daily as needed for itching or take an over the counter antihistamine such as Zyrtec, Claritin, or Benadryl (generic versions are okay too).  Please call an eye specialist tomorrow to schedule a follow up appointment later this week in case your symptoms continue to worsen.  They may need to lance (cut open) the stye on your upper eyelid).

## 2018-01-26 NOTE — ED Provider Notes (Signed)
[facility]    CSN: [id number] Arrival date & time: 01/26/18  1129     History   Chief Complaint Chief Complaint  Patient presents with  . Eye Pain    HPI Patricia Barajas is a 57 y.o. female.   HPI Patricia Barajas is a 57 y.o. female presenting to UC with c/o worsening Left eye swelling, itching and pain.  She has used the erythromycin ointment w/o relief. Pt concerned her symptoms have worsened since yesterday and wonders if she needs an oral antibiotic.  Denies fever or chills. No change in vision.   Past Medical History:  Diagnosis Date  . Perimenopausal     Patient Active Problem List   Diagnosis Date Noted  . Menopausal disorder 04/08/2012  . HEMATURIA UNSPECIFIED 11/29/2008  . RAYNAUD'S DISEASE 08/20/2006  . HYPERTENSION, BENIGN SYSTEMIC 05/21/2006    Past Surgical History:  Procedure Laterality Date  . ADENOIDECTOMY    . TUBAL LIGATION      OB History   None      Home Medications    Prior to Admission medications   Medication Sig Start Date End Date Taking? Authorizing Provider  amLODipine (NORVASC) 5 MG tablet Take 1 tablet (5 mg total) by mouth daily. 11/07/17   Agapito GamesMetheney, Catherine D, MD  doxycycline (VIBRAMYCIN) 100 MG capsule Take 1 capsule (100 mg total) by mouth 2 (two) times daily. One po bid x 7 days 01/26/18   Lurene ShadowPhelps, Ruba Outen O, PA-C  GARLIC PO Take 4,0981,000 mg by mouth daily.    [provider]  Misc Natural Products (LUTEIN 20 PO) Take 1 capsule by mouth daily.    [provider]  olopatadine (PATANOL) 0.1 % ophthalmic solution Place 1 drop into the left eye 2 (two) times daily. As needed for itching 01/26/18   Lurene ShadowPhelps, Blong Busk O, PA-C  Specialty Vitamins Products (MAGNESIUM, AMINO ACID CHELATE,) 133 MG tablet Take 1 tablet by mouth 2 (two) times daily.    [provider]  trimethoprim-polymyxin b (POLYTRIM) ophthalmic solution Place 1 drop into the left eye every 4 (four) hours for 7 days. 01/26/18 02/02/18   Lurene ShadowPhelps, Diamond Martucci O, PA-C    Family History Family History  Problem Relation Age of Onset  . Hypertension Mother   . Stroke Father   . Cancer Maternal Grandfather        brain    Social History Social History   Tobacco Use  . Smoking status: Never Smoker  . Smokeless tobacco: Never Used  Substance Use Topics  . Alcohol use: No  . Drug use: No     Allergies   Sulfonamide derivatives and Penicillins   Review of Systems Review of Systems  Constitutional: Negative for chills and fever.  Eyes: Positive for pain, redness and itching. Negative for photophobia, discharge and visual disturbance.  Neurological: Negative for headaches.     Physical Exam Triage Vital Signs ED Triage Vitals  Enc Vitals Group     BP      Pulse      Resp      Temp      Temp src      SpO2      Weight      Height      Head Circumference      Peak Flow      Pain Score      Pain Loc      Pain Edu?      Excl. in GC?  No data found.  Updated Vital Signs BP (!) 146/79 (BP Location: Right Arm)   Pulse 94   Temp 97.8 F (36.6 C) (Oral)   Resp 16   Ht 5\' 6"  (1.676 m)   Wt 110 lb (49.9 kg)   SpO2 100%   BMI 17.75 kg/m   Visual Acuity Right Eye Distance:   Left Eye Distance:   Bilateral Distance:    Right Eye Near:   Left Eye Near:    Bilateral Near:     Physical Exam  Constitutional: She is oriented to person, place, and time. She appears well-developed and well-nourished.  HENT:  Head: Normocephalic and atraumatic.  Mouth/Throat: Oropharynx is clear and moist.  Eyes: Pupils are equal, round, and reactive to light. Conjunctivae and EOM are normal. Left eye exhibits hordeolum. Left conjunctiva is not injected.    Left eye: Large hordeolum to upper eyelid, tender. Smaller hordeolum on lower eyelid. Mildly tender. Mild erythema and edema and tenderness near medial canthus.   Neck: Normal range of motion.  Cardiovascular: Normal rate.  Pulmonary/Chest: Effort normal.    Musculoskeletal: Normal range of motion.  Neurological: She is alert and oriented to person, place, and time.  Skin: Skin is warm and dry.  Psychiatric: She has a normal mood and affect. Her behavior is normal.  Nursing note and vitals reviewed.    UC Treatments / Results  Labs (all labs ordered are listed, but only abnormal results are displayed) Labs Reviewed - No data to display  EKG None  Radiology No results found.  Procedures Procedures (including critical care time)  Medications Ordered in UC Medications - No data to display  Initial Impression / Assessment and Plan / UC Course  I have reviewed the triage vital signs and the nursing notes.  Pertinent labs & imaging results that were available during my care of the patient were reviewed by me and considered in my medical decision making (see chart for details).     Question possible allergic reaction to the erythromycin.  Will change pt to Polytrim eye drops and prescribe oral doxycycline. Concern for early periorbital cellulitis. Encouraged to call eye specialist tomorrow for f/u.  Final Clinical Impressions(s) / UC Diagnoses   Final diagnoses:  Follow up     Discharge Instructions      Please stop using the erythromycin ointment in case you are having an allergic reaction to the medication.  Start using the antibiotic eye drop- Polytrim (trimethoprim-polymixin B) as prescribed for infection.   You may also try the olopatadine (Patanol) eye drops twice daily as needed for itching or take an over the counter antihistamine such as Zyrtec, Claritin, or Benadryl (generic versions are okay too).  Please call an eye specialist tomorrow to schedule a follow up appointment later this week in case your symptoms continue to worsen.  They may need to lance (cut open) the stye on your upper eyelid).     ED Prescriptions    Medication Sig Dispense Auth. Provider   trimethoprim-polymyxin b (POLYTRIM) ophthalmic  solution Place 1 drop into the left eye every 4 (four) hours for 7 days. 10 mL Doroteo Glassman, Kiamesha Samet O, PA-C   olopatadine (PATANOL) 0.1 % ophthalmic solution Place 1 drop into the left eye 2 (two) times daily. As needed for itching 5 mL Doroteo Glassman, Jerick Khachatryan O, PA-C   doxycycline (VIBRAMYCIN) 100 MG capsule Take 1 capsule (100 mg total) by mouth 2 (two) times daily. One po bid x 7 days 14 capsule Doroteo Glassman,  Vangie Bicker, PA-C     Controlled Substance Prescriptions [place] Controlled Substance Registry consulted? Not Applicable   Rolla Plate 01/26/18 1310

## 2018-01-26 NOTE — ED Triage Notes (Signed)
[facility]/O a sty on the left eye since Monday. She was seen here yesterday with C/O the same woke this morn eye edematous and feeling worse.

## 2018-01-27 ENCOUNTER — Ambulatory Visit (INDEPENDENT_AMBULATORY_CARE_PROVIDER_SITE_OTHER): Payer: BLUE CROSS/BLUE SHIELD | Admitting: Family Medicine

## 2018-01-27 ENCOUNTER — Encounter: Payer: Self-pay | Admitting: Family Medicine

## 2018-01-27 VITALS — BP 125/72 | HR 106 | Ht 66.0 in | Wt 115.0 lb

## 2018-01-27 DIAGNOSIS — H00029 Hordeolum internum unspecified eye, unspecified eyelid: Secondary | ICD-10-CM

## 2018-01-27 DIAGNOSIS — H00014 Hordeolum externum left upper eyelid: Secondary | ICD-10-CM

## 2018-01-27 NOTE — Progress Notes (Signed)
Subjective:    Patient ID: Patricia Barajas, female    DOB: 05/10/1961, 57 y.o.   MRN: [id number]  HPI Stye x 1 wk. Saturday her L lower eyelid was swollen she was seen at UC over the weekend due to her sxs not getting any better.. pt took pictures. she has not had any changes to her makeup. moisturizers,soaps,she did not use the  Allergy eye drop but has been using the Polytrim eyedrop.  And she did start the oral doxycycline yesterday.  Unfortunately it continues to get swollen and now she feels like some of the edema is actually tracking downward over her cheekbone.  No fevers chills or sweats.  She has had some green crusting in the corner of her eye. No fever.  She is very concerned because she feels it is been getting progressively worse.  She did start the doxycycline yesterday it has not been 24 hours yet.   Review of Systems  BP 125/72   Pulse (!) 106   Ht 5\' 6"  (1.676 m)   Wt 115 lb (52.2 kg)   SpO2 98%   BMI 18.56 kg/m     Allergies  Allergen Reactions  . Sulfonamide Derivatives Hives  . Penicillins Rash    Past Medical History:  Diagnosis Date  . Perimenopausal     Past Surgical History:  Procedure Laterality Date  . ADENOIDECTOMY    . TUBAL LIGATION      Social History   Socioeconomic History  . Marital status: Widowed    Spouse name: Not on file  . Number of children: Not on file  . Years of education: Not on file  . Highest education level: Not on file  Occupational History  . Not on file  Social Needs  . Financial resource strain: Not on file  . Food insecurity:    Worry: Not on file    Inability: Not on file  . Transportation needs:    Medical: Not on file    Non-medical: Not on file  Tobacco Use  . Smoking status: Never Smoker  . Smokeless tobacco: Never Used  Substance and Sexual Activity  . Alcohol use: No  . Drug use: No  . Sexual activity: Not on file  Lifestyle  . Physical activity:    Days per week: Not on file    Minutes per  session: Not on file  . Stress: Not on file  Relationships  . Social connections:    Talks on phone: Not on file    Gets together: Not on file    Attends religious service: Not on file    Active member of club or organization: Not on file    Attends meetings of clubs or organizations: Not on file    Relationship status: Not on file  . Intimate partner violence:    Fear of current or ex partner: Not on file    Emotionally abused: Not on file    Physically abused: Not on file    Forced sexual activity: Not on file  Other Topics Concern  . Not on file  Social History Narrative   She is widowed.     Family History  Problem Relation Age of Onset  . Hypertension Mother   . Stroke Father   . Cancer Maternal Grandfather        brain    Outpatient Encounter Medications as of 01/27/2018  Medication Sig  . amLODipine (NORVASC) 5 MG tablet Take 1 tablet (5 mg total)  by mouth daily.  Marland Kitchen doxycycline (VIBRAMYCIN) 100 MG capsule Take 1 capsule (100 mg total) by mouth 2 (two) times daily. One po bid x 7 days  . GARLIC PO Take 1,610 mg by mouth daily.  . Misc Natural Products (LUTEIN 20 PO) Take 1 capsule by mouth daily.  Marland Kitchen Specialty Vitamins Products (MAGNESIUM, AMINO ACID CHELATE,) 133 MG tablet Take 1 tablet by mouth 2 (two) times daily.  Marland Kitchen trimethoprim-polymyxin b (POLYTRIM) ophthalmic solution Place 1 drop into the left eye every 4 (four) hours for 7 days.  . [DISCONTINUED] olopatadine (PATANOL) 0.1 % ophthalmic solution Place 1 drop into the left eye 2 (two) times daily. As needed for itching (Patient not taking: Reported on 01/27/2018)   No facility-administered encounter medications on file as of 01/27/2018.          Objective:   Physical Exam            Assessment & Plan:  Hordeolum of left upper eyelid -she also has some inflamed by me and glands on the lower eyelid as well as well as some swelling.  She has some erythema and some green crusting in the medial corner of  the eye.  Extraocular movements are intact.  It seems to be getting progressively worse and will try to get her in with an eye doctor today or early tomorrow if at all possible.  The other option would be to put her on a steroid but the doxycycline should help cover any type of staph infection in addition to reducing inflammation.  She has not been on it for quite 24 hours yet.

## 2018-01-28 ENCOUNTER — Encounter: Payer: Self-pay | Admitting: Family Medicine

## 2018-01-28 DIAGNOSIS — H0014 Chalazion left upper eyelid: Secondary | ICD-10-CM | POA: Diagnosis not present

## 2018-01-28 DIAGNOSIS — H0015 Chalazion left lower eyelid: Secondary | ICD-10-CM | POA: Diagnosis not present

## 2018-01-29 ENCOUNTER — Encounter: Payer: Self-pay | Admitting: Family Medicine

## 2018-02-21 DIAGNOSIS — H0015 Chalazion left lower eyelid: Secondary | ICD-10-CM | POA: Diagnosis not present

## 2018-02-21 DIAGNOSIS — H0014 Chalazion left upper eyelid: Secondary | ICD-10-CM | POA: Diagnosis not present

## 2018-03-04 LAB — BASIC METABOLIC PANEL
BUN: 8 (ref 4–21)
CREATININE: 0.7 (ref 0.5–1.1)
POTASSIUM: 4 (ref 3.4–5.3)
SODIUM: 138 (ref 137–147)

## 2018-03-04 LAB — HEPATIC FUNCTION PANEL
ALK PHOS: 61 (ref 25–125)
ALT: 17 (ref 7–35)
AST: 22 (ref 13–35)
Bilirubin, Direct: 0.2 (ref 0.01–0.4)
Bilirubin, Total: 0.9

## 2018-03-04 LAB — HEMOGLOBIN A1C: Hemoglobin A1C: 5.2

## 2018-03-04 LAB — LIPID PANEL
Cholesterol: 187 (ref 0–200)
HDL: 103 — AB (ref 35–70)
TRIGLYCERIDES: 66 (ref 40–160)

## 2018-03-04 LAB — CBC AND DIFFERENTIAL
HEMATOCRIT: 42 (ref 36–46)
HEMOGLOBIN: 14.2 (ref 12.0–16.0)
Platelets: 256 (ref 150–399)
WBC: 6.6

## 2018-03-04 LAB — TSH: TSH: 1.36 (ref 0.41–5.90)

## 2018-03-14 ENCOUNTER — Encounter: Payer: Self-pay | Admitting: Family Medicine

## 2018-03-14 NOTE — Progress Notes (Signed)
Lab report received from employee wellness program. Labs will be abstracted but quick summary below A1c 5.2 Creatinine 0.71 Metabolic panel largely within normal limits. Iron 145 Total cholesterol 187 Triglycerides 66 HDL 103 LDL 71 Hemoglobin 16.114.2 TSH 1.36

## 2018-03-19 ENCOUNTER — Encounter: Payer: Self-pay | Admitting: Family Medicine

## 2018-03-19 LAB — RBC: RBC: 4.47

## 2018-03-19 LAB — URIC ACID: Uric Acid: 5.2

## 2018-04-06 DIAGNOSIS — H0015 Chalazion left lower eyelid: Secondary | ICD-10-CM | POA: Diagnosis not present

## 2018-04-06 DIAGNOSIS — H0014 Chalazion left upper eyelid: Secondary | ICD-10-CM | POA: Diagnosis not present

## 2018-04-08 DIAGNOSIS — H00013 Hordeolum externum right eye, unspecified eyelid: Secondary | ICD-10-CM | POA: Diagnosis not present

## 2018-04-08 DIAGNOSIS — H9201 Otalgia, right ear: Secondary | ICD-10-CM | POA: Diagnosis not present

## 2018-04-08 DIAGNOSIS — Z880 Allergy status to penicillin: Secondary | ICD-10-CM | POA: Diagnosis not present

## 2018-04-08 DIAGNOSIS — H538 Other visual disturbances: Secondary | ICD-10-CM | POA: Diagnosis not present

## 2018-04-08 DIAGNOSIS — Z882 Allergy status to sulfonamides status: Secondary | ICD-10-CM | POA: Diagnosis not present

## 2018-04-08 DIAGNOSIS — H66001 Acute suppurative otitis media without spontaneous rupture of ear drum, right ear: Secondary | ICD-10-CM | POA: Diagnosis not present

## 2018-04-08 DIAGNOSIS — I10 Essential (primary) hypertension: Secondary | ICD-10-CM | POA: Diagnosis not present

## 2018-04-08 DIAGNOSIS — Z79899 Other long term (current) drug therapy: Secondary | ICD-10-CM | POA: Diagnosis not present

## 2018-04-09 ENCOUNTER — Ambulatory Visit (INDEPENDENT_AMBULATORY_CARE_PROVIDER_SITE_OTHER): Payer: BLUE CROSS/BLUE SHIELD | Admitting: Family Medicine

## 2018-04-09 ENCOUNTER — Encounter: Payer: Self-pay | Admitting: Family Medicine

## 2018-04-09 ENCOUNTER — Telehealth: Payer: Self-pay | Admitting: Family Medicine

## 2018-04-09 VITALS — BP 116/69 | HR 100 | Ht 66.0 in | Wt 115.0 lb

## 2018-04-09 DIAGNOSIS — H0014 Chalazion left upper eyelid: Secondary | ICD-10-CM

## 2018-04-09 DIAGNOSIS — R059 Cough, unspecified: Secondary | ICD-10-CM

## 2018-04-09 DIAGNOSIS — H0015 Chalazion left lower eyelid: Secondary | ICD-10-CM | POA: Diagnosis not present

## 2018-04-09 DIAGNOSIS — H9201 Otalgia, right ear: Secondary | ICD-10-CM | POA: Diagnosis not present

## 2018-04-09 DIAGNOSIS — H5319 Other subjective visual disturbances: Secondary | ICD-10-CM | POA: Diagnosis not present

## 2018-04-09 DIAGNOSIS — R05 Cough: Secondary | ICD-10-CM | POA: Diagnosis not present

## 2018-04-09 MED ORDER — BENZONATATE 200 MG PO CAPS
200.0000 mg | ORAL_CAPSULE | Freq: Three times a day (TID) | ORAL | 0 refills | Status: DC | PRN
Start: 2018-04-09 — End: 2018-04-24

## 2018-04-09 MED ORDER — IPRATROPIUM BROMIDE 0.06 % NA SOLN: 2.0000 | NASAL | 6 refills | Status: DC | PRN

## 2018-04-09 NOTE — Patient Instructions (Addendum)
Thank you for coming in today. Use OTC zyrtec as needed.  Complete course of Omnicef antibiotic.  Continue over the counter medicine.  Ok to fill and use Atrovent nasal spray for post nasal drainage.  Ok to fill and use tessalon pearles for cough as needed.   Call or go to the emergency room if you get worse, have trouble breathing, have chest pains, or palpitations.   Recheck as needed.   When you get a flu shot at work let us know.  If you would like you can schedule a nurse visit for a flu shot in the future.

## 2018-04-09 NOTE — Progress Notes (Signed)
Patricia Barajas is a 57 y.o. female who presents to [facility]: [facility] today for ear pain congestion and hoarse voice.  [name] recently traveled back from [place] on August 24.  She is feeling pretty well until yesterday when she developed acute pain in her right ear.  She was seen in the urgent care where she was thought to have acute otitis media and was treated with oral Omnicef.  She has had 3 doses of Omnicef and notes that she is feeling a bit better with less ear pain.  She denies any change in hearing or vertigo.  She does note some nasal congestion and drainage and postnasal drip.  She has this is been ongoing for about 4 days.  She denies significant shortness of breath vomiting chest pain or palpitations.  She notes a mild nonproductive cough as well.  She is tried some over-the-counter medications which have been quite helpful.  Chalazion: Patient has been seen several times by her PCP for hordeolum of her eyelid.  She was recently seen by her ophthalmologist to notes that is developed into chalazion.  She has a second opinion regarding surgical options. ROS as above:  Exam:  BP 116/69   Pulse 100   Ht 5\' 6"  (1.676 m)   Wt 115 lb (52.2 kg)   BMI 18.56 kg/m  Wt Readings from Last 5 Encounters:  04/09/18 115 lb (52.2 kg)  01/27/18 115 lb (52.2 kg)  01/26/18 110 lb (49.9 kg)  01/25/18 110 lb (49.9 kg)  11/07/17 109 lb (49.4 kg)    Gen: Well NAD HEENT: EOMI,  MMM chalazion left upper eyelid.  Mild small  chalazion left lower lid.  Posterior pharynx mild cobblestoning.  Normal tympanic membranes left.  Right tympanic membrane is mildly erythematous without retraction or bulging or significant fluid. Mastoids are nontender bilaterally.  No significant cervical lymphadenopathy present.  Inflamed nasal turbinates bilaterally. Lungs: Normal work of breathing. CTABL Heart:  RRR no MRG Abd: NABS, Soft. Nondistended, Nontender Exts: Brisk capillary refill, warm and well perfused.      Assessment and Plan: [age] y.o. female with  Right ear pain.  Possibly early resolving otitis media.  Plan to continue and complete course of Omnicef.  Continue symptomatic management.  Recheck as needed.  Cough and postnasal drainage and congestion.  Likely viral URI.  Symptomatic management over-the-counter medications.  Additionally back-up prescription for Atrovent nasal spray and Tessalon Perles prescribed.  Chalazion: Management per ophthalmology.   No orders of the defined types were placed in this encounter.  Meds ordered this encounter  Medications  . ipratropium (ATROVENT) 0.06 % nasal spray    Sig: Place 2 sprays into both nostrils every 4 (four) hours as needed.    Dispense:  10 mL    Refill:  6  . benzonatate (TESSALON) 200 MG capsule    Sig: Take 1 capsule (200 mg total) by mouth 3 (three) times daily as needed for cough.    Dispense:  45 capsule    Refill:  0     Historical information moved to improve visibility of documentation.  Past Medical History:  Diagnosis Date  . Perimenopausal    Past Surgical History:  Procedure Laterality Date  . ADENOIDECTOMY    . TUBAL LIGATION     Social History   Tobacco Use  . Smoking status: Never Smoker  . Smokeless tobacco: Never Used  Substance Use Topics  . Alcohol  use: No   family history includes Cancer in her maternal grandfather; Hypertension in her mother; Stroke in her father.  Medications: Current Outpatient Medications  Medication Sig Dispense Refill  . amLODipine (NORVASC) 5 MG tablet Take 1 tablet (5 mg total) by mouth daily. 90 tablet 3  . GARLIC PO Take 1,191 mg by mouth daily.    . Misc Natural Products (LUTEIN 20 PO) Take 1 capsule by mouth daily.    Marland Kitchen Specialty Vitamins Products (MAGNESIUM, AMINO ACID CHELATE,) 133 MG tablet Take 1 tablet by mouth 2 (two) times daily.    . benzonatate  (TESSALON) 200 MG capsule Take 1 capsule (200 mg total) by mouth 3 (three) times daily as needed for cough. 45 capsule 0  . ipratropium (ATROVENT) 0.06 % nasal spray Place 2 sprays into both nostrils every 4 (four) hours as needed. 10 mL 6   No current facility-administered medications for this visit.    Allergies  Allergen Reactions  . Sulfonamide Derivatives Hives  . Penicillins Rash     Discussed warning signs or symptoms. Please see discharge instructions. Patient expresses understanding.

## 2018-04-09 NOTE — Telephone Encounter (Signed)
PT was seen at a [facility] hospital for infection behind the ear drum.   She was advised for a hospital FU with PCP. With the busy schedule what would you advise.

## 2018-04-20 ENCOUNTER — Encounter: Payer: Self-pay | Admitting: Family Medicine

## 2018-04-24 ENCOUNTER — Encounter: Payer: Self-pay | Admitting: Family Medicine

## 2018-04-24 ENCOUNTER — Ambulatory Visit: Payer: BLUE CROSS/BLUE SHIELD | Admitting: Family Medicine

## 2018-04-24 VITALS — BP 124/65 | HR 78 | Ht 66.0 in | Wt 114.0 lb

## 2018-04-24 DIAGNOSIS — Z23 Encounter for immunization: Secondary | ICD-10-CM

## 2018-04-24 DIAGNOSIS — H9191 Unspecified hearing loss, right ear: Secondary | ICD-10-CM

## 2018-04-24 DIAGNOSIS — H938X1 Other specified disorders of right ear: Secondary | ICD-10-CM | POA: Diagnosis not present

## 2018-04-24 DIAGNOSIS — H9201 Otalgia, right ear: Secondary | ICD-10-CM

## 2018-04-24 NOTE — Progress Notes (Signed)
Subjective:    CC:   HPI: States he is a 57 year old female who is here today to follow-up for right ear pain.  She was [facility] department on April 09, 2018.  She is actually flown on vacation, to [place], in the day after she got back started feeling poorly pretty quickly.  She had actually Artie been on doxycycline which was prescribed for a stye on her left upper eyelid: doxycycline hyclate 100 mg twice a day   Used from: 01/19/18 -02/21/18 (5 weeks) and she was diagnosed with acute separative otitis media of the right ear without spontaneous rupture.  She was started on Ceftin ear 300 mg twice a day.  10 days.Used from: 04/09/18 - 04/18/18 and she then followed up with 1 of my partners on August 28.  Notes she also had upper respiratory symptoms at the time.  She was encouraged to complete her Omnicef and add over-the-counter Zyrtec as well also given a prescription for Atrovent nasal spray and Tessalon Perles if needed.  She is coming in today to have the right ear rechecked.  She says the severe pain that she was experience and has completely resolved and the fullness is improved but not completely resolved.  She is still having some decreased hearing.  She will notice if she turns her on her other side she cannot hear her noise maker at night as well in that ear.  She has had problems with ear infections in the past.  She had also initially contacted me about some back pain that she was experiencing from her cough as well as some bilateral foot pain but that actually seems to have resolved on its own this week.  Still struggling with the stye on her left upper eye.  She is only able to do the warm compresses etc. twice a day when she was on vacation and now that she is been back she is been trying to do it at least 3 times a day but with working full-time cannot get in a fourth time.  It seems to have gotten larger.  Her follow-up is in about 2 weeks.  Past medical  history, Surgical history, Family history not pertinant except as noted below, Social history, Allergies, and medications have been entered into the medical record, reviewed, and corrections made.   Review of Systems: No fevers, chills, night sweats, weight loss, chest pain, or shortness of breath.   Objective:    General: Well Developed, well nourished, and in no acute distress.  Neuro: Alert and oriented x3, extra-ocular muscles intact,  HEENT: Normocephalic, atraumatic, tympanic membranes are normal bilaterally no erythema or fluid.  Canals are clear as well.  Does have a fairly large stye on her left upper eyelid just along the edge. Skin: Warm and dry, no rashes. Cardiac: not examined Respiratory: Not using accessory muscles, speaking in full sentences.   Impression and Recommendations:    Stye, left upper eyelid -keep follow-up appointment in 2 weeks with ophthalmology.  At that point I do not know if they were lancet it is getting larger.  Right ear fullness and change in hearing -tympanic membrane looks fantastic.  No significant erythema no fluid on exam.  We will go ahead and refer her to [place] here in [place].  If in the interim the fullness and hearing completely resolved and she can always cancel the appointment if not encourage her to keep it.  Flu vaccine given today.

## 2018-05-09 DIAGNOSIS — H0015 Chalazion left lower eyelid: Secondary | ICD-10-CM | POA: Diagnosis not present

## 2018-05-09 DIAGNOSIS — H5319 Other subjective visual disturbances: Secondary | ICD-10-CM | POA: Diagnosis not present

## 2018-05-09 DIAGNOSIS — H0014 Chalazion left upper eyelid: Secondary | ICD-10-CM | POA: Diagnosis not present

## 2018-05-15 DIAGNOSIS — H0014 Chalazion left upper eyelid: Secondary | ICD-10-CM | POA: Diagnosis not present

## 2018-05-19 DIAGNOSIS — H93293 Other abnormal auditory perceptions, bilateral: Secondary | ICD-10-CM | POA: Diagnosis not present

## 2018-05-19 DIAGNOSIS — H6983 Other specified disorders of Eustachian tube, bilateral: Secondary | ICD-10-CM | POA: Diagnosis not present

## 2018-06-05 DIAGNOSIS — H0015 Chalazion left lower eyelid: Secondary | ICD-10-CM | POA: Diagnosis not present

## 2018-06-05 DIAGNOSIS — H0014 Chalazion left upper eyelid: Secondary | ICD-10-CM | POA: Diagnosis not present

## 2018-06-05 DIAGNOSIS — H5319 Other subjective visual disturbances: Secondary | ICD-10-CM | POA: Diagnosis not present

## 2018-07-22 DIAGNOSIS — Z124 Encounter for screening for malignant neoplasm of cervix: Secondary | ICD-10-CM | POA: Diagnosis not present

## 2018-07-22 DIAGNOSIS — Z681 Body mass index (BMI) 19 or less, adult: Secondary | ICD-10-CM | POA: Diagnosis not present

## 2018-07-22 DIAGNOSIS — Z1231 Encounter for screening mammogram for malignant neoplasm of breast: Secondary | ICD-10-CM | POA: Diagnosis not present

## 2018-07-22 DIAGNOSIS — Z01419 Encounter for gynecological examination (general) (routine) without abnormal findings: Secondary | ICD-10-CM | POA: Diagnosis not present

## 2018-07-22 LAB — HM MAMMOGRAPHY

## 2018-07-31 ENCOUNTER — Encounter: Payer: Self-pay | Admitting: Family Medicine

## 2018-08-19 DIAGNOSIS — R87615 Unsatisfactory cytologic smear of cervix: Secondary | ICD-10-CM | POA: Diagnosis not present

## 2018-08-29 DIAGNOSIS — H00024 Hordeolum internum left upper eyelid: Secondary | ICD-10-CM | POA: Diagnosis not present

## 2018-08-29 DIAGNOSIS — H00025 Hordeolum internum left lower eyelid: Secondary | ICD-10-CM | POA: Diagnosis not present

## 2018-08-29 DIAGNOSIS — H5319 Other subjective visual disturbances: Secondary | ICD-10-CM | POA: Diagnosis not present

## 2018-09-11 DIAGNOSIS — H00024 Hordeolum internum left upper eyelid: Secondary | ICD-10-CM | POA: Diagnosis not present

## 2018-09-11 DIAGNOSIS — H5319 Other subjective visual disturbances: Secondary | ICD-10-CM | POA: Diagnosis not present

## 2018-09-11 DIAGNOSIS — H00025 Hordeolum internum left lower eyelid: Secondary | ICD-10-CM | POA: Diagnosis not present

## 2018-09-18 DIAGNOSIS — H0100A Unspecified blepharitis right eye, upper and lower eyelids: Secondary | ICD-10-CM | POA: Diagnosis not present

## 2018-09-18 DIAGNOSIS — H0015 Chalazion left lower eyelid: Secondary | ICD-10-CM | POA: Diagnosis not present

## 2018-09-18 DIAGNOSIS — H2513 Age-related nuclear cataract, bilateral: Secondary | ICD-10-CM | POA: Diagnosis not present

## 2018-09-18 DIAGNOSIS — H0100B Unspecified blepharitis left eye, upper and lower eyelids: Secondary | ICD-10-CM | POA: Diagnosis not present

## 2018-10-30 ENCOUNTER — Other Ambulatory Visit: Payer: Self-pay | Admitting: Family Medicine

## 2018-11-20 ENCOUNTER — Encounter: Payer: BLUE CROSS/BLUE SHIELD | Admitting: Family Medicine

## 2018-11-24 ENCOUNTER — Telehealth: Payer: Self-pay | Admitting: Family Medicine

## 2018-11-24 DIAGNOSIS — Z0000 Encounter for general adult medical examination without abnormal findings: Secondary | ICD-10-CM

## 2018-11-24 NOTE — Telephone Encounter (Signed)
Labs ordered and pended. Also faxed for ROI from GYN for last pap [facility] Dr. Dareen Piano).Heath Gold, CMA

## 2018-11-24 NOTE — Telephone Encounter (Signed)
Signed. Thank you.

## 2018-11-24 NOTE — Telephone Encounter (Signed)
Dr. Linford Arnold.    Patricia Barajas is going to reschedule her Physical but she would like to come in on 12/02/2018 to have her labs drawn.  Can you please put the orders in so she can go straight to the lab. Thank you  [name]

## 2018-11-25 LAB — HM PAP SMEAR: HM Pap smear: NEGATIVE

## 2018-12-02 ENCOUNTER — Encounter: Payer: BLUE CROSS/BLUE SHIELD | Admitting: Family Medicine

## 2018-12-02 DIAGNOSIS — Z0000 Encounter for general adult medical examination without abnormal findings: Secondary | ICD-10-CM | POA: Diagnosis not present

## 2018-12-03 LAB — COMPLETE METABOLIC PANEL WITH GFR
AG Ratio: 1.6 (calc) (ref 1.0–2.5)
ALT: 15 U/L (ref 6–29)
AST: 22 U/L (ref 10–35)
Albumin: 4.5 g/dL (ref 3.6–5.1)
Alkaline phosphatase (APISO): 54 U/L (ref 37–153)
BUN/Creatinine Ratio: 8 (calc) (ref 6–22)
BUN: 6 mg/dL — ABNORMAL LOW (ref 7–25)
CO2: 28 mmol/L (ref 20–32)
Calcium: 9.6 mg/dL (ref 8.6–10.4)
Chloride: 104 mmol/L (ref 98–110)
Creat: 0.75 mg/dL (ref 0.50–1.05)
GFR, Est African American: 103 mL/min/{1.73_m2} (ref >=60)
GFR, Est Non African American: 88 mL/min/{1.73_m2} (ref >=60)
Globulin: 2.8 g/dL (calc) (ref 1.9–3.7)
Glucose, Bld: 91 mg/dL (ref 65–99)
Potassium: 3.9 mmol/L (ref 3.5–5.3)
Sodium: 140 mmol/L (ref 135–146)
Total Bilirubin: 1.1 mg/dL (ref 0.2–1.2)
Total Protein: 7.3 g/dL (ref 6.1–8.1)

## 2018-12-03 LAB — CBC WITH DIFFERENTIAL/PLATELET
Absolute Monocytes: 407 cells/uL (ref 200–950)
Basophils Absolute: 59 cells/uL (ref 0–200)
Basophils Relative: 1.4 %
Eosinophils Absolute: 59 cells/uL (ref 15–500)
Eosinophils Relative: 1.4 %
HCT: 41.2 % (ref 35.0–45.0)
Hemoglobin: 14.3 g/dL (ref 11.7–15.5)
Lymphs Abs: 1155 cells/uL (ref 850–3900)
MCH: 32.4 pg (ref 27.0–33.0)
MCHC: 34.7 g/dL (ref 32.0–36.0)
MCV: 93.4 fL (ref 80.0–100.0)
MPV: 10.5 fL (ref 7.5–12.5)
Monocytes Relative: 9.7 %
Neutro Abs: 2520 cells/uL (ref 1500–7800)
Neutrophils Relative %: 60 %
Platelets: 262 10*3/uL (ref 140–400)
RBC: 4.41 10*6/uL (ref 3.80–5.10)
RDW: 11.8 % (ref 11.0–15.0)
Total Lymphocyte: 27.5 %
WBC: 4.2 10*3/uL (ref 3.8–10.8)

## 2018-12-03 LAB — LIPID PANEL
Cholesterol: 200 mg/dL — ABNORMAL HIGH (ref <200)
HDL: 94 mg/dL (ref >=50)
LDL Cholesterol (Calc): 91 mg/dL (calc)
Non-HDL Cholesterol (Calc): 106 mg/dL (calc) (ref <130)
Total CHOL/HDL Ratio: 2.1 (calc) (ref <5.0)
Triglycerides: 64 mg/dL (ref <150)

## 2018-12-03 LAB — TSH: TSH: 1.39 mIU/L (ref 0.40–4.50)

## 2019-01-13 DIAGNOSIS — H0100B Unspecified blepharitis left eye, upper and lower eyelids: Secondary | ICD-10-CM | POA: Diagnosis not present

## 2019-01-13 DIAGNOSIS — H0100A Unspecified blepharitis right eye, upper and lower eyelids: Secondary | ICD-10-CM | POA: Diagnosis not present

## 2019-01-13 DIAGNOSIS — H0015 Chalazion left lower eyelid: Secondary | ICD-10-CM | POA: Diagnosis not present

## 2019-02-12 ENCOUNTER — Encounter: Payer: Self-pay | Admitting: Family Medicine

## 2019-02-23 ENCOUNTER — Other Ambulatory Visit: Payer: Self-pay

## 2019-02-23 ENCOUNTER — Encounter: Payer: Self-pay | Admitting: Osteopathic Medicine

## 2019-02-23 ENCOUNTER — Ambulatory Visit (INDEPENDENT_AMBULATORY_CARE_PROVIDER_SITE_OTHER): Payer: BC Managed Care – PPO | Admitting: Osteopathic Medicine

## 2019-02-23 VITALS — BP 117/67 | HR 78 | Temp 98.2°F | Wt 116.9 lb

## 2019-02-23 DIAGNOSIS — R399 Unspecified symptoms and signs involving the genitourinary system: Secondary | ICD-10-CM

## 2019-02-23 LAB — POCT URINALYSIS DIPSTICK
Bilirubin, UA: NEGATIVE
Glucose, UA: NEGATIVE
Ketones, UA: NEGATIVE
Leukocytes, UA: NEGATIVE
Nitrite, UA: NEGATIVE
Protein, UA: NEGATIVE
Spec Grav, UA: 1.01 (ref 1.010–1.025)
Urobilinogen, UA: 0.2 E.U./dL
pH, UA: 7.5 (ref 5.0–8.0)

## 2019-02-23 MED ORDER — NITROFURANTOIN MONOHYD MACRO 100 MG PO CAPS: 100.0000 mg | ORAL_CAPSULE | Freq: Two times a day (BID) | ORAL | 0 refills | Status: DC

## 2019-02-23 NOTE — Progress Notes (Signed)
HPI: Patricia Barajas is a 58 y.o. female who  has a past medical history of Perimenopausal.  she presents to [facility] today, 02/23/19,  for chief complaint of:  Concern for UTI   Lower pelvic area.suprapubic pain for about 4 days worse w/ voiding. Urgency and frequency on Sat/Sun seems a bit better now. Reports pinching type pain w/ urination.       At today's visit 02/23/19 ... PMH, PSH, FH reviewed and updated as needed.  Current medication list and allergy/intolerance hx reviewed and updated as needed. (See remainder of HPI, ROS, Phys Exam below)   No results found.  Results for orders placed or performed in visit on 02/23/19 (from the past 72 hour(s))  POCT Urinalysis Dipstick     Status: None   Collection Time: 02/23/19  1:28 PM  Result Value Ref Range   Color, UA Yellow    Clarity, UA Clear    Glucose, UA Negative Negative   Bilirubin, UA Negative    Ketones, UA Negative    Spec Grav, UA 1.010 1.010 - 1.025   Blood, UA Small    pH, UA 7.5 5.0 - 8.0   Protein, UA Negative Negative   Urobilinogen, UA 0.2 0.2 or 1.0 E.U./dL   Nitrite, UA Negative    Leukocytes, UA Negative Negative   Appearance     Odor            ASSESSMENT/PLAN: The encounter diagnosis was UTI symptoms.  Unimpressive UA, symptoms seem c/w UTI vs possibly anatomic issue but doesn't really sound like prolapse either. Pt would liek to hold off on abx unless absolutely needed, I went ahead and sent abx in, in case worse prior to culture results/micro. Will call pt w/ results when available and update plan as needed. Pt advised start abx if worse. Consider RTC for pelvic exam if no improvement.    Orders Placed This Encounter  Procedures  . Urine Culture  . Urinalysis, microscopic only  . POCT Urinalysis Dipstick     Meds ordered this encounter  Medications  . nitrofurantoin, macrocrystal-monohydrate, (MACROBID) 100 MG capsule    Sig: Take 1 capsule  (100 mg total) by mouth 2 (two) times daily.    Dispense:  10 capsule    Refill:  0     Follow-up plan: Return if symptoms worsen or fail to improve.                                                 ################################################# ################################################# ################################################# #################################################    Current Meds  Medication Sig  . amLODipine (NORVASC) 5 MG tablet TAKE 1 TABLET DAILY  . GARLIC PO Take 5,631 mg by mouth daily.  . Misc Natural Products (LUTEIN 20 PO) Take 1 capsule by mouth daily.  Marland Kitchen Specialty Vitamins Products (MAGNESIUM, AMINO ACID CHELATE,) 133 MG tablet Take 1 tablet by mouth 2 (two) times daily.    Allergies  Allergen Reactions  . Penicillins Rash  . Sulfa Antibiotics Rash  . Erythromycin Base Other (See Comments)       Review of Systems:  Constitutional: No recent illness  HEENT: No  headache, no vision change  Cardiac: No  chest pain, No  pressure, No palpitations  Respiratory:  No  shortness of breath. No  Cough  Gastrointestinal: No  abdominal pain  Musculoskeletal: No new myalgia/arthralgia  Skin: No  Rash  Neurologic: No  weakness, No  Dizziness  Exam:  BP 117/67 (BP Location: Left Arm, Patient Position: Sitting, Cuff Size: Normal)   Pulse 78   Temp 98.2 F (36.8 C) (Oral)   Wt 116 lb 14.4 oz (53 kg)   BMI 18.87 kg/m   Constitutional: VS see above. General Appearance: alert, well-developed, well-nourished, NAD  Eyes: Normal lids and conjunctive, non-icteric sclera  Neck: No masses, trachea midline.   Respiratory: Normal respiratory effort.   Musculoskeletal: Gait normal. Symmetric and independent movement of all extremities  Neurological: Normal balance/coordination. No tremor.  Skin: warm, dry, intact.   Psychiatric: Normal judgment/insight. Normal mood and affect. Oriented  x3.       Visit summary with medication list and pertinent instructions was printed for patient to review, patient was advised to alert us if any updates are needed. All questions at time of visit were answered - patient instructed to contact office with any additional concerns. ER/RTC precautions were reviewed with the patient and understanding verbalized.     Please note: voice recognition software was used to produce this document, and typos may escape review. Please contact Dr. Lyn HollingsheadAlexander for any needed clarifications.    Follow up plan: Return if symptoms worsen or fail to improve.

## 2019-02-24 LAB — URINE CULTURE
MICRO NUMBER:: 660412
SPECIMEN QUALITY:: ADEQUATE

## 2019-02-24 LAB — URINALYSIS, MICROSCOPIC ONLY
Bacteria, UA: NONE SEEN /HPF
Hyaline Cast: NONE SEEN /LPF
RBC / HPF: NONE SEEN /HPF (ref 0–2)
Squamous Epithelial / LPF: NONE SEEN /HPF (ref <=5)
WBC, UA: NONE SEEN /HPF (ref 0–5)

## 2019-03-02 DIAGNOSIS — L821 Other seborrheic keratosis: Secondary | ICD-10-CM | POA: Diagnosis not present

## 2019-03-02 DIAGNOSIS — D2372 Other benign neoplasm of skin of left lower limb, including hip: Secondary | ICD-10-CM | POA: Diagnosis not present

## 2019-03-02 DIAGNOSIS — D224 Melanocytic nevi of scalp and neck: Secondary | ICD-10-CM | POA: Diagnosis not present

## 2019-05-26 DIAGNOSIS — H0100A Unspecified blepharitis right eye, upper and lower eyelids: Secondary | ICD-10-CM | POA: Diagnosis not present

## 2019-05-26 DIAGNOSIS — H0100B Unspecified blepharitis left eye, upper and lower eyelids: Secondary | ICD-10-CM | POA: Diagnosis not present

## 2019-06-30 DIAGNOSIS — H0100B Unspecified blepharitis left eye, upper and lower eyelids: Secondary | ICD-10-CM | POA: Diagnosis not present

## 2019-06-30 DIAGNOSIS — H0100A Unspecified blepharitis right eye, upper and lower eyelids: Secondary | ICD-10-CM | POA: Diagnosis not present

## 2019-09-02 ENCOUNTER — Other Ambulatory Visit: Payer: Self-pay | Admitting: Family Medicine

## 2019-09-03 NOTE — Telephone Encounter (Signed)
Needs appointment

## 2019-09-27 DIAGNOSIS — H66002 Acute suppurative otitis media without spontaneous rupture of ear drum, left ear: Secondary | ICD-10-CM | POA: Diagnosis not present

## 2019-09-28 ENCOUNTER — Ambulatory Visit: Payer: BC Managed Care – PPO | Admitting: Family Medicine

## 2019-09-28 ENCOUNTER — Ambulatory Visit (INDEPENDENT_AMBULATORY_CARE_PROVIDER_SITE_OTHER): Payer: BC Managed Care – PPO | Admitting: Family Medicine

## 2019-09-28 ENCOUNTER — Other Ambulatory Visit: Payer: Self-pay

## 2019-09-28 ENCOUNTER — Encounter: Payer: Self-pay | Admitting: Family Medicine

## 2019-09-28 VITALS — BP 114/60 | HR 98 | Ht 66.0 in | Wt 115.0 lb

## 2019-09-28 DIAGNOSIS — Z681 Body mass index (BMI) 19 or less, adult: Secondary | ICD-10-CM | POA: Diagnosis not present

## 2019-09-28 DIAGNOSIS — Z124 Encounter for screening for malignant neoplasm of cervix: Secondary | ICD-10-CM | POA: Diagnosis not present

## 2019-09-28 DIAGNOSIS — H6123 Impacted cerumen, bilateral: Secondary | ICD-10-CM

## 2019-09-28 DIAGNOSIS — Z01419 Encounter for gynecological examination (general) (routine) without abnormal findings: Secondary | ICD-10-CM | POA: Diagnosis not present

## 2019-09-28 DIAGNOSIS — S00412A Abrasion of left ear, initial encounter: Secondary | ICD-10-CM | POA: Diagnosis not present

## 2019-09-28 DIAGNOSIS — Z1231 Encounter for screening mammogram for malignant neoplasm of breast: Secondary | ICD-10-CM | POA: Diagnosis not present

## 2019-09-28 LAB — HM MAMMOGRAPHY

## 2019-09-28 NOTE — Progress Notes (Signed)
Acute Office Visit  Subjective:    Patient ID: Patricia Barajas, female    DOB: 07-02-1961, 59 y.o.   MRN: [id number]  Chief Complaint  Patient presents with  . Follow-up    HPI Patient is in today for for bleeding in her ears.  She went to [facility].  She says they did a curettage and then actually irrigated her ears but I started bleeding.  So they gave her mupirocin ointment and started her on azithromycin.  She says even last night she felt it sort of trickling down her ear got up right after she went to bed and checked it out there was still some fresh blood and then when she woke up this morning she had about 3 circles of blood from that left ear in particular.  The right ear seems to no longer be bleeding.  No fevers chills or sweats.  No hearing loss.  She did have some discomfort initially.  Past Medical History:  Diagnosis Date  . Perimenopausal     Past Surgical History:  Procedure Laterality Date  . ADENOIDECTOMY    . TUBAL LIGATION      Family History  Problem Relation Age of Onset  . Hypertension Mother   . Stroke Father   . Cancer Maternal Grandfather        brain    Social History   Socioeconomic History  . Marital status: Widowed    Spouse name: Not on file  . Number of children: Not on file  . Years of education: Not on file  . Highest education level: Not on file  Occupational History  . Not on file  Tobacco Use  . Smoking status: Never Smoker  . Smokeless tobacco: Never Used  Substance and Sexual Activity  . Alcohol use: No  . Drug use: No  . Sexual activity: Not on file  Other Topics Concern  . Not on file  Social History Narrative   She is widowed.    Social Determinants of Health   Financial Resource Strain:   . Difficulty of Paying Living Expenses: Not on file  Food Insecurity:   . Worried About Charity fundraiser in the Last Year: Not on file  . Ran Out of Food in the Last Year: Not on file  Transportation Needs:   .  Lack of Transportation (Medical): Not on file  . Lack of Transportation (Non-Medical): Not on file  Physical Activity:   . Days of Exercise per Week: Not on file  . Minutes of Exercise per Session: Not on file  Stress:   . Feeling of Stress : Not on file  Social Connections:   . Frequency of Communication with Friends and Family: Not on file  . Frequency of Social Gatherings with Friends and Family: Not on file  . Attends Religious Services: Not on file  . Active Member of Clubs or Organizations: Not on file  . Attends Archivist Meetings: Not on file  . Marital Status: Not on file  Intimate Partner Violence:   . Fear of Current or Ex-Partner: Not on file  . Emotionally Abused: Not on file  . Physically Abused: Not on file  . Sexually Abused: Not on file    Outpatient Medications Prior to Visit  Medication Sig Dispense Refill  . amLODipine (NORVASC) 5 MG tablet Take 1 tablet (5 mg total) by mouth daily. MUST MAKE APPOINTMENT 90 tablet 0  . GARLIC PO Take 6,314 mg by  mouth daily.    . Misc Natural Products (LUTEIN 20 PO) Take 1 capsule by mouth daily.    . nitrofurantoin, macrocrystal-monohydrate, (MACROBID) 100 MG capsule Take 1 capsule (100 mg total) by mouth 2 (two) times daily. 10 capsule 0  . Specialty Vitamins Products (MAGNESIUM, AMINO ACID CHELATE,) 133 MG tablet Take 1 tablet by mouth 2 (two) times daily.     No facility-administered medications prior to visit.    Allergies  Allergen Reactions  . Penicillins Rash  . Sulfa Antibiotics Rash  . Erythromycin Base Other (See Comments)    Review of Systems     Objective:    Physical Exam Vitals reviewed.  Constitutional:      Appearance: She is well-developed.  HENT:     Head: Normocephalic and atraumatic.     Comments: In the left canal there is fresh blood just sitting at the bottom of the ear.  He also has an excoriation coming out of the ear and around the 12 o'clock position.  The one at 12 o'clock  position is not actively bleeding.  The tympanic membrane appears normal.  In the right canal there is a ridge of tissue at the 6 o'clock position.  It may be scar tissue with some dried blood around the area.    Right Ear: Tympanic membrane normal.     Left Ear: Tympanic membrane normal.  Eyes:     Conjunctiva/sclera: Conjunctivae normal.  Cardiovascular:     Rate and Rhythm: Normal rate.  Pulmonary:     Effort: Pulmonary effort is normal.  Skin:    General: Skin is dry.     Coloration: Skin is not pale.  Neurological:     Mental Status: She is alert and oriented to person, place, and time.  Psychiatric:        Behavior: Behavior normal.     BP 114/60   Pulse 98   Ht 5\' 6"  (1.676 m)   Wt 115 lb (52.2 kg)   SpO2 100%   BMI 18.56 kg/m  Wt Readings from Last 3 Encounters:  09/28/19 115 lb (52.2 kg)  02/23/19 116 lb 14.4 oz (53 kg)  04/24/18 114 lb (51.7 kg)    There are no preventive care reminders to display for this patient.  There are no preventive care reminders to display for this patient.   Lab Results  Component Value Date   TSH 1.39 12/02/2018   Lab Results  Component Value Date   WBC 4.2 12/02/2018   HGB 14.3 12/02/2018   HCT 41.2 12/02/2018   MCV 93.4 12/02/2018   PLT 262 12/02/2018   Lab Results  Component Value Date   NA 140 12/02/2018   K 3.9 12/02/2018   CO2 28 12/02/2018   GLUCOSE 91 12/02/2018   BUN 6 (L) 12/02/2018   CREATININE 0.75 12/02/2018   BILITOT 1.1 12/02/2018   ALKPHOS 61 03/04/2018   AST 22 12/02/2018   ALT 15 12/02/2018   PROT 7.3 12/02/2018   ALBUMIN 4.3 10/18/2016   CALCIUM 9.6 12/02/2018   Lab Results  Component Value Date   CHOL 200 (H) 12/02/2018   Lab Results  Component Value Date   HDL 94 12/02/2018   Lab Results  Component Value Date   LDLCALC 91 12/02/2018   Lab Results  Component Value Date   TRIG 64 12/02/2018   Lab Results  Component Value Date   CHOLHDL 2.1 12/02/2018   Lab Results  Component  Value Date  HGBA1C 5.2 03/04/2018       Assessment & Plan:   Problem List Items Addressed This Visit    None    Visit Diagnoses    Ear abrasion, left, initial encounter    -  Primary     Ear abrasion with active bleeding on the left ear canal.  No trauma to the tympanic membrane.  I used a sterile applicator to remove some of the blood just by lightly tapping the area and then used a nitro swab to achieve hemostasis.  Patient tolerated well.  Avoid sticking anything in the ear or getting wet for the next 2 days.  If she has any persistent bleeding then please let us know.  If it continues to bleed actively then will need to get her in with ENT urgently.  Cerumen impaction -we discussed just using Debrox drops for 4 days every 2 weeks to remove any additional wax and to keep it nice and soft.  Hopefully this will prevent her from having to have any type of cerumen removal and reduce risk for trauma to the canals themselves.  No orders of the defined types were placed in this encounter.    Nani Gasser, MD

## 2019-10-05 ENCOUNTER — Encounter: Payer: Self-pay | Admitting: Family Medicine

## 2019-10-22 ENCOUNTER — Encounter: Payer: Self-pay | Admitting: Family Medicine

## 2019-11-15 ENCOUNTER — Other Ambulatory Visit: Payer: Self-pay | Admitting: Family Medicine

## 2020-01-27 ENCOUNTER — Other Ambulatory Visit: Payer: Self-pay | Admitting: Family Medicine

## 2020-02-09 DIAGNOSIS — G43109 Migraine with aura, not intractable, without status migrainosus: Secondary | ICD-10-CM | POA: Diagnosis not present

## 2020-03-07 DIAGNOSIS — D225 Melanocytic nevi of trunk: Secondary | ICD-10-CM | POA: Diagnosis not present

## 2020-03-07 DIAGNOSIS — L570 Actinic keratosis: Secondary | ICD-10-CM | POA: Diagnosis not present

## 2020-04-09 ENCOUNTER — Other Ambulatory Visit: Payer: Self-pay | Admitting: Family Medicine

## 2020-04-12 DIAGNOSIS — F411 Generalized anxiety disorder: Secondary | ICD-10-CM | POA: Diagnosis not present

## 2020-04-26 DIAGNOSIS — F411 Generalized anxiety disorder: Secondary | ICD-10-CM | POA: Diagnosis not present

## 2020-05-24 DIAGNOSIS — F411 Generalized anxiety disorder: Secondary | ICD-10-CM | POA: Diagnosis not present

## 2020-06-13 ENCOUNTER — Telehealth: Payer: Self-pay | Admitting: Family Medicine

## 2020-06-13 NOTE — Telephone Encounter (Signed)
Please call pt: I received his labs. I am happy to talk with her abut the results if she would like. We can do a virtual visit if she would like

## 2020-06-14 NOTE — Telephone Encounter (Signed)
Appointment has been made. No further questions at this time.  

## 2020-06-17 ENCOUNTER — Telehealth (INDEPENDENT_AMBULATORY_CARE_PROVIDER_SITE_OTHER): Payer: BC Managed Care – PPO | Admitting: Family Medicine

## 2020-06-17 ENCOUNTER — Encounter: Payer: Self-pay | Admitting: Family Medicine

## 2020-06-17 DIAGNOSIS — R17 Unspecified jaundice: Secondary | ICD-10-CM

## 2020-06-17 DIAGNOSIS — R718 Other abnormality of red blood cells: Secondary | ICD-10-CM

## 2020-06-17 NOTE — Progress Notes (Signed)
Virtual Visit via Telephone Note  I connected with Patricia Barajas on 06/17/20 at  2:00 PM EDT by telephone and verified that I am speaking with the correct person using two identifiers.   I discussed the limitations, risks, security and privacy concerns of performing an evaluation and management service by telephone and the availability of in person appointments. I also discussed with the patient that there may be a patient responsible charge related to this service. The patient expressed understanding and agreed to proceed.  Patient location: at home  Provider loccation: In office   Subjective:    CC: go over lab results.    HPI: States he actually had blood work done at work and had dropped off those results.  We decided to go over those over the telephone today so that we could go over the results and answer any questions that she might have.  See scanned results for details but in summary, her A1c looks great at 5.0.  Total cholesterol is just borderline at 209 but she has a phenomenal HDL at 115 with normal triglycerides and LDL.  Her iron level was just slightly elevated at 177.  Normal liver enzymes including GGT, AST and ALT.  Phosphate was slightly elevated at 4.8.  Uric acid look great.  Kidney function was stable at 0.75 with normal BUN.  Glucose looks great as well.  Electrolytes were normal.  Total bilirubin slightly elevated at 1.3.  And her hemoglobin was normal at 14.5 with a slightly increased MCV of 100.9.  Platelets were normal.  Thyroid looks great at 1.25.   Past medical history, Surgical history, Family history not pertinant except as noted below, Social history, Allergies, and medications have been entered into the medical record, reviewed, and corrections made.   Review of Systems: No fevers, chills, night sweats, weight loss, chest pain, or shortness of breath.   Objective:    General: Speaking clearly in complete sentences without any shortness of breath.  Alert  and oriented x3.  Normal judgment. No apparent acute distress.    Impression and Recommendations:    Elevated bilirubin-just borderline not in a worrisome range.  Bated MCV-she is can work on trying to eat foods that are rich in folate and B12 in the next time we do blood work will consider adding those labs in.  Iron level slightly elevated not in a worrisome range for hemochromatosis.  She does eat a lot of iron rich foods and it has been higher previously so its actually little better this time.   I discussed the assessment and treatment plan with the patient. The patient was provided an opportunity to ask questions and all were answered. The patient agreed with the plan and demonstrated an understanding of the instructions.   The patient was advised to call back or seek an in-person evaluation if the symptoms worsen or if the condition fails to improve as anticipated.  I provided 13 minutes of non-face-to-face time during this encounter.   Nani Gasser, MD

## 2020-06-22 ENCOUNTER — Other Ambulatory Visit: Payer: Self-pay | Admitting: Family Medicine

## 2020-07-17 ENCOUNTER — Other Ambulatory Visit: Payer: Self-pay | Admitting: Family Medicine

## 2020-11-11 ENCOUNTER — Other Ambulatory Visit: Payer: Self-pay | Admitting: *Deleted

## 2020-11-11 DIAGNOSIS — Z0000 Encounter for general adult medical examination without abnormal findings: Secondary | ICD-10-CM

## 2020-11-11 NOTE — Progress Notes (Signed)
lvm advising pt that her labs have been ordered

## 2020-11-13 ENCOUNTER — Encounter: Payer: Self-pay | Admitting: Family Medicine

## 2020-11-13 DIAGNOSIS — E538 Deficiency of other specified B group vitamins: Secondary | ICD-10-CM

## 2020-11-13 DIAGNOSIS — Z0000 Encounter for general adult medical examination without abnormal findings: Secondary | ICD-10-CM

## 2020-11-13 DIAGNOSIS — I10 Essential (primary) hypertension: Secondary | ICD-10-CM

## 2020-11-14 NOTE — Telephone Encounter (Signed)
Please add B12 and folate to labs ordered for her physical.  I believe those were ordered on 4 1 but we will probably have to cancel those and put in one complete order or they were only draw part of them.

## 2020-12-09 ENCOUNTER — Encounter: Payer: Self-pay | Admitting: Family Medicine

## 2020-12-09 ENCOUNTER — Other Ambulatory Visit: Payer: Self-pay

## 2020-12-09 ENCOUNTER — Ambulatory Visit (INDEPENDENT_AMBULATORY_CARE_PROVIDER_SITE_OTHER): Payer: BC Managed Care – PPO | Admitting: Family Medicine

## 2020-12-09 VITALS — BP 127/73 | HR 86 | Ht 66.0 in | Wt 115.0 lb

## 2020-12-09 DIAGNOSIS — E538 Deficiency of other specified B group vitamins: Secondary | ICD-10-CM | POA: Diagnosis not present

## 2020-12-09 DIAGNOSIS — Z0000 Encounter for general adult medical examination without abnormal findings: Secondary | ICD-10-CM

## 2020-12-09 DIAGNOSIS — H6122 Impacted cerumen, left ear: Secondary | ICD-10-CM | POA: Diagnosis not present

## 2020-12-09 DIAGNOSIS — I10 Essential (primary) hypertension: Secondary | ICD-10-CM | POA: Diagnosis not present

## 2020-12-09 NOTE — Progress Notes (Signed)
Subjective:     Patricia Barajas is a 60 y.o. female and is here for a comprehensive physical exam. The patient reports no problems.  She follows for GYN and is up-to-date with her Pap smear.  She is doing her mammograms every other year now.  Social History   Socioeconomic History  . Marital status: Widowed    Spouse name: Not on file  . Number of children: Not on file  . Years of education: Not on file  . Highest education level: Not on file  Occupational History  . Not on file  Tobacco Use  . Smoking status: Never Smoker  . Smokeless tobacco: Never Used  Substance and Sexual Activity  . Alcohol use: No  . Drug use: No  . Sexual activity: Not on file  Other Topics Concern  . Not on file  Social History Narrative   She is widowed.    Social Determinants of Health   Financial Resource Strain: Not on file  Food Insecurity: Not on file  Transportation Needs: Not on file  Physical Activity: Not on file  Stress: Not on file  Social Connections: Not on file  Intimate Partner Violence: Not on file   Health Maintenance  Topic Date Due  . COVID-19 Vaccine (3 - Booster) 05/17/2020  . INFLUENZA VACCINE  03/13/2021  . MAMMOGRAM  09/27/2021  . COLONOSCOPY (Pts 45-74yrs Insurance coverage will need to be confirmed)  11/22/2021  . PAP SMEAR-Modifier  11/24/2021  . TETANUS/TDAP  05/10/2027  . Hepatitis C Screening  Completed  . HIV Screening  Completed  . HPV VACCINES  Aged Out    The following portions of the patient's history were reviewed and updated as appropriate: allergies, current medications, past family history, past medical history, past social history, past surgical history and problem list.  Review of Systems A comprehensive review of systems was negative.   Objective:    BP 127/73   Pulse 86   Ht 5\' 6"  (1.676 m)   Wt 115 lb (52.2 kg)   SpO2 100%   BMI 18.56 kg/m  General appearance: alert, cooperative and appears stated age Head: Normocephalic, without  obvious abnormality, atraumatic Eyes: conj clear, EOMI, PEERLA Ears: normal TM's and external ear canals both ears Nose: Nares normal. Septum midline. Mucosa normal. No drainage or sinus tenderness. Throat: lips, mucosa, and tongue normal; teeth and gums normal Neck: no adenopathy, no carotid bruit, no JVD, supple, symmetrical, trachea midline and thyroid not enlarged, symmetric, no tenderness/mass/nodules Back: symmetric, no curvature. ROM normal. No CVA tenderness. Lungs: clear to auscultation bilaterally Heart: regular rate and rhythm, S1, S2 normal, no murmur, click, rub or gallop Abdomen: soft, non-tender; bowel sounds normal; no masses,  no organomegaly Extremities: extremities normal, atraumatic, no cyanosis or edema Pulses: 2+ and symmetric Skin: Skin color, texture, turgor normal. No rashes or lesions Lymph nodes: Cervical adenopathy: nl and Supraclavicular adenopathy: nl Neurologic: Alert and oriented X 3, normal strength and tone. Normal symmetric reflexes. Normal coordination and gait    Assessment:    Healthy female exam.      Plan:     See After Visit Summary for Counseling Recommendations   Keep up a regular exercise program and make sure you are eating a healthy diet Try to eat 4 servings of dairy a day, or if you are lactose intolerant take a calcium with vitamin D daily.  Your vaccines are up to date.  Had labs drawn earlier today.    Left cerumen  impaction -left ear completely occluded by light-colored wax.  She has had to have it irrigated previously.  Indication: Cerumen impaction of the ear(s) Medical necessity statement: On physical examination, cerumen impairs clinically significant portions of the external auditory canal, and tympanic membrane. Noted obstructive, copious cerumen that cannot be removed without magnification and instrumentations  Consent: Discussed benefits and risks of procedure and verbal consent obtained Procedure: Patient was prepped for  the procedure. Utilized an otoscope to assess and take note of the ear canal, the tympanic membrane, and the presence, amount, and placement of the cerumen. Gentle water irrigation and soft plastic curette was utilized to remove cerumen.  Post procedure examination: shows cerumen was completely removed. Patient tolerated procedure well. The patient is made aware that they may experience temporary vertigo, temporary hearing loss, and temporary discomfort. If these symptom last for more than 24 hours to call the clinic or proceed to the ED.

## 2020-12-09 NOTE — Patient Instructions (Signed)
Preventive Care 60-60 Years Old, Female Preventive care refers to lifestyle choices and visits with your health care provider that can promote health and wellness. This includes:  A yearly physical exam. This is also called an annual wellness visit.  Regular dental and eye exams.  Immunizations.  Screening for certain conditions.  Healthy lifestyle choices, such as: ? Eating a healthy diet. ? Getting regular exercise. ? Not using drugs or products that contain nicotine and tobacco. ? Limiting alcohol use. What can I expect for my preventive care visit? Physical exam Your health care provider will check your:  Height and weight. These may be used to calculate your BMI (body mass index). BMI is a measurement that tells if you are at a healthy weight.  Heart rate and blood pressure.  Body temperature.  Skin for abnormal spots. Counseling Your health care provider may ask you questions about your:  Past medical problems.  Family's medical history.  Alcohol, tobacco, and drug use.  Emotional well-being.  Home life and relationship well-being.  Sexual activity.  Diet, exercise, and sleep habits.  Work and work Statistician.  Access to firearms.  Method of birth control.  Menstrual cycle.  Pregnancy history. What immunizations do I need? Vaccines are usually given at various ages, according to a schedule. Your health care provider will recommend vaccines for you based on your age, medical history, and lifestyle or other factors, such as travel or where you work.   What tests do I need? Blood tests  Lipid and cholesterol levels. These may be checked every 5 years, or more often if you are over 60 years old.  Hepatitis C test.  Hepatitis B test. Screening  Lung cancer screening. You may have this screening every year starting at age 60 if you have a 30-pack-year history of smoking and currently smoke or have quit within the past 15 years.  Colorectal cancer  screening. ? All adults should have this screening starting at age 60 and continuing until age 60. ? Your health care provider may recommend screening at age 60 if you are at increased risk. ? You will have tests every 1-10 years, depending on your results and the type of screening test.  Diabetes screening. ? This is done by checking your blood sugar (glucose) after you have not eaten for a while (fasting). ? You may have this done every 1-3 years.  Mammogram. ? This may be done every 1-2 years. ? Talk with your health care provider about when you should start having regular mammograms. This may depend on whether you have a family history of breast cancer.  BRCA-related cancer screening. This may be done if you have a family history of breast, ovarian, tubal, or peritoneal cancers.  Pelvic exam and Pap test. ? This may be done every 3 years starting at age 60. ? Starting at age 60, this may be done every 5 years if you have a Pap test in combination with an HPV test. Other tests  STD (sexually transmitted disease) testing, if you are at risk.  Bone density scan. This is done to screen for osteoporosis. You may have this scan if you are at high risk for osteoporosis. Talk with your health care provider about your test results, treatment options, and if necessary, the need for more tests. Follow these instructions at home: Eating and drinking  Eat a diet that includes fresh fruits and vegetables, whole grains, lean protein, and low-fat dairy products.  Take vitamin and mineral supplements  as recommended by your health care provider.  Do not drink alcohol if: ? Your health care provider tells you not to drink. ? You are pregnant, may be pregnant, or are planning to become pregnant.  If you drink alcohol: ? Limit how much you have to 0-1 drink a day. ? Be aware of how much alcohol is in your drink. In the [place] one drink equals one 12 oz bottle of beer (355 mL), one 5 oz glass of  wine (148 mL), or one 1 oz glass of hard liquor (44 mL).   Lifestyle  Take daily care of your teeth and gums. Brush your teeth every morning and night with fluoride toothpaste. Floss one time each day.  Stay active. Exercise for at least 30 minutes 5 or more days each week.  Do not use any products that contain nicotine or tobacco, such as cigarettes, e-cigarettes, and chewing tobacco. If you need help quitting, ask your health care provider.  Do not use drugs.  If you are sexually active, practice safe sex. Use a condom or other form of protection to prevent STIs (sexually transmitted infections).  If you do not wish to become pregnant, use a form of birth control. If you plan to become pregnant, see your health care provider for a prepregnancy visit.  If told by your health care provider, take low-dose aspirin daily starting at age 60.  Find healthy ways to cope with stress, such as: ? Meditation, yoga, or listening to music. ? Journaling. ? Talking to a trusted person. ? Spending time with friends and family. Safety  Always wear your seat belt while driving or riding in a vehicle.  Do not drive: ? If you have been drinking alcohol. Do not ride with someone who has been drinking. ? When you are tired or distracted. ? While texting.  Wear a helmet and other protective equipment during sports activities.  If you have firearms in your house, make sure you follow all gun safety procedures. What's next?  Visit your health care provider once a year for an annual wellness visit.  Ask your health care provider how often you should have your eyes and teeth checked.  Stay up to date on all vaccines. This information is not intended to replace advice given to you by your health care provider. Make sure you discuss any questions you have with your health care provider. Document Revised: 05/03/2020 Document Reviewed: 04/10/2018 Elsevier Patient Education  2021 [employer].

## 2020-12-10 LAB — COMPLETE METABOLIC PANEL WITH GFR
AG Ratio: 1.8 (calc) (ref 1.0–2.5)
ALT: 13 U/L (ref 6–29)
AST: 19 U/L (ref 10–35)
Albumin: 4.5 g/dL (ref 3.6–5.1)
Alkaline phosphatase (APISO): 56 U/L (ref 37–153)
BUN: 7 mg/dL (ref 7–25)
CO2: 29 mmol/L (ref 20–32)
Calcium: 9.4 mg/dL (ref 8.6–10.4)
Chloride: 104 mmol/L (ref 98–110)
Creat: 0.7 mg/dL (ref 0.50–1.05)
GFR, Est African American: 110 mL/min/{1.73_m2} (ref >=60)
GFR, Est Non African American: 95 mL/min/{1.73_m2} (ref >=60)
Globulin: 2.5 g/dL (calc) (ref 1.9–3.7)
Glucose, Bld: 88 mg/dL (ref 65–99)
Potassium: 3.9 mmol/L (ref 3.5–5.3)
Sodium: 140 mmol/L (ref 135–146)
Total Bilirubin: 1.2 mg/dL (ref 0.2–1.2)
Total Protein: 7 g/dL (ref 6.1–8.1)

## 2020-12-10 LAB — LIPID PANEL W/REFLEX DIRECT LDL
Cholesterol: 190 mg/dL (ref <200)
HDL: 97 mg/dL (ref >=50)
LDL Cholesterol (Calc): 79 mg/dL (calc)
Non-HDL Cholesterol (Calc): 93 mg/dL (calc) (ref <130)
Total CHOL/HDL Ratio: 2 (calc) (ref <5.0)
Triglycerides: 62 mg/dL (ref <150)

## 2020-12-10 LAB — CBC WITH DIFFERENTIAL/PLATELET
Absolute Monocytes: 447 cells/uL (ref 200–950)
Basophils Absolute: 60 cells/uL (ref 0–200)
Basophils Relative: 1.4 %
Eosinophils Absolute: 52 cells/uL (ref 15–500)
Eosinophils Relative: 1.2 %
HCT: 41.8 % (ref 35.0–45.0)
Hemoglobin: 14.1 g/dL (ref 11.7–15.5)
Lymphs Abs: 1402 cells/uL (ref 850–3900)
MCH: 31.9 pg (ref 27.0–33.0)
MCHC: 33.7 g/dL (ref 32.0–36.0)
MCV: 94.6 fL (ref 80.0–100.0)
MPV: 10.4 fL (ref 7.5–12.5)
Monocytes Relative: 10.4 %
Neutro Abs: 2339 cells/uL (ref 1500–7800)
Neutrophils Relative %: 54.4 %
Platelets: 237 10*3/uL (ref 140–400)
RBC: 4.42 10*6/uL (ref 3.80–5.10)
RDW: 11.7 % (ref 11.0–15.0)
Total Lymphocyte: 32.6 %
WBC: 4.3 10*3/uL (ref 3.8–10.8)

## 2020-12-10 LAB — B12 AND FOLATE PANEL
Folate: 15.7 ng/mL
Vitamin B-12: 285 pg/mL (ref 200–1100)

## 2020-12-10 LAB — TSH: TSH: 1.64 mIU/L (ref 0.40–4.50)

## 2020-12-11 ENCOUNTER — Other Ambulatory Visit: Payer: Self-pay | Admitting: Family Medicine

## 2021-02-24 LAB — LIPID PANEL
Cholesterol: 183 (ref 0–200)
HDL: 88 — AB (ref 35–70)
LDL Cholesterol: 81
LDl/HDL Ratio: 2.1
Triglycerides: 65 (ref 40–160)

## 2021-02-24 LAB — COMPREHENSIVE METABOLIC PANEL
Albumin: 4 (ref 3.5–5.0)
Calcium: 9.2 (ref 8.7–10.7)
GFR calc non Af Amer: 87
Globulin: 2.8

## 2021-02-24 LAB — TSH: TSH: 2 (ref 0.41–5.90)

## 2021-02-24 LAB — CBC AND DIFFERENTIAL
HCT: 43 (ref 36–46)
Hemoglobin: 14.3 (ref 12.0–16.0)
Platelets: 281 (ref 150–399)
WBC: 5.4

## 2021-02-24 LAB — HEMOGLOBIN A1C: Hemoglobin A1C: 5

## 2021-02-24 LAB — HEPATIC FUNCTION PANEL
ALT: 15 (ref 7–35)
AST: 22 (ref 13–35)
Alkaline Phosphatase: 62 (ref 25–125)
Bilirubin, Direct: 0.1 (ref 0.01–0.4)
Bilirubin, Total: 0.7

## 2021-02-24 LAB — IRON,TIBC AND FERRITIN PANEL: Iron: 83

## 2021-02-24 LAB — BASIC METABOLIC PANEL
BUN: 6 (ref 4–21)
Creatinine: 0.8 (ref 0.5–1.1)
Glucose: 80

## 2021-02-24 LAB — CBC: RBC: 4.38 (ref 3.87–5.11)

## 2021-03-17 ENCOUNTER — Encounter: Payer: Self-pay | Admitting: Family Medicine

## 2021-03-17 ENCOUNTER — Other Ambulatory Visit: Payer: Self-pay

## 2021-03-17 ENCOUNTER — Ambulatory Visit: Payer: BC Managed Care – PPO | Admitting: Family Medicine

## 2021-03-17 VITALS — BP 124/76 | HR 89 | Temp 98.5°F | Ht 66.0 in | Wt 117.0 lb

## 2021-03-17 DIAGNOSIS — J3489 Other specified disorders of nose and nasal sinuses: Secondary | ICD-10-CM | POA: Diagnosis not present

## 2021-03-17 DIAGNOSIS — I10 Essential (primary) hypertension: Secondary | ICD-10-CM

## 2021-03-17 MED ORDER — MUPIROCIN 2 % EX OINT: TOPICAL_OINTMENT | CUTANEOUS | 0 refills | Status: DC

## 2021-03-17 NOTE — Assessment & Plan Note (Signed)
Blood pressure is at goal. 

## 2021-03-17 NOTE — Progress Notes (Signed)
Established Patient Office Visit  Subjective:  Patient ID: Patricia Barajas, female    DOB: 1961/05/07  Age: 60 y.o. MRN: [id number]  CC:  Chief Complaint  Patient presents with   dry and bleeding nose x 3 months      HPI Jairo Ben presents for nose dry and bleeding x 3 mo, started saline and thinks helping some. No allergy sxs.    She says that it has actually been bothersome enough that it is actually felt sore to touch her wash her face and nose.  She has not had any significant sinus congestion or sinus symptoms per se.  She does not normally have allergies this time a year and has not been experiencing other allergy symptoms.  Hypertension- Pt denies chest pain, SOB, dizziness, or heart palpitations.  Taking meds as directed w/o problems.  Denies medication side effects.    This is a tough time of year for her as it is the anniversary of her husband's passing and she turned 32 this year that she is planning on going on vacation to [place], [place]   Past Medical History:  Diagnosis Date   Perimenopausal     Past Surgical History:  Procedure Laterality Date   ADENOIDECTOMY     TUBAL LIGATION      Family History  Problem Relation Age of Onset   Hypertension Mother    Stroke Father    Cancer Maternal Grandfather        brain    Social History   Socioeconomic History   Marital status: Widowed    Spouse name: Not on file   Number of children: Not on file   Years of education: Not on file   Highest education level: Not on file  Occupational History   Not on file  Tobacco Use   Smoking status: Never   Smokeless tobacco: Never  Substance and Sexual Activity   Alcohol use: No   Drug use: No   Sexual activity: Not on file  Other Topics Concern   Not on file  Social History Narrative   She is widowed.    Social Determinants of Health   Financial Resource Strain: Not on file  Food Insecurity: Not on file  Transportation Needs: Not on file  Physical Activity:  Not on file  Stress: Not on file  Social Connections: Not on file  Intimate Partner Violence: Not on file    Outpatient Medications Prior to Visit  Medication Sig Dispense Refill   amLODipine (NORVASC) 5 MG tablet TAKE 1 TABLET DAILY 90 tablet 1   GARLIC PO Take 6,948 mg by mouth daily.     Misc Natural Products (LUTEIN 20 PO) Take 1 capsule by mouth daily.     Specialty Vitamins Products (MAGNESIUM, AMINO ACID CHELATE,) 133 MG tablet Take 1 tablet by mouth 2 (two) times daily.     No facility-administered medications prior to visit.    Allergies  Allergen Reactions   Penicillins Rash   Sulfa Antibiotics Rash   Erythromycin Base Other (See Comments)    ROS Review of Systems    Objective:    Physical Exam Constitutional:      Appearance: She is well-developed.  HENT:     Head: Normocephalic and atraumatic.     Right Ear: Tympanic membrane, ear canal and external ear normal.     Left Ear: Tympanic membrane, ear canal and external ear normal.     Nose: Nose normal. No congestion or rhinorrhea.  Comments: Mild irritation in the left nostril.    Mouth/Throat:     Pharynx: No oropharyngeal exudate or posterior oropharyngeal erythema.  Eyes:     Conjunctiva/sclera: Conjunctivae normal.     Pupils: Pupils are equal, round, and reactive to light.  Neck:     Thyroid: No thyromegaly.  Pulmonary:     Effort: Pulmonary effort is normal.     Breath sounds: No wheezing.  Musculoskeletal:     Cervical back: Neck supple.  Lymphadenopathy:     Cervical: No cervical adenopathy.  Skin:    General: Skin is warm and dry.  Neurological:     Mental Status: She is alert and oriented to person, place, and time.    BP 124/76   Pulse 89   Temp 98.5 F (36.9 C) (Oral)   Ht 5\' 6"  (1.676 m)   Wt 117 lb (53.1 kg)   SpO2 100% Comment: on RA  BMI 18.88 kg/m  Wt Readings from Last 3 Encounters:  03/17/21 117 lb (53.1 kg)  12/09/20 115 lb (52.2 kg)  09/28/19 115 lb (52.2 kg)      Health Maintenance Due  Topic Date Due   Zoster Vaccines- Shingrix (1 of 2) Never done   COVID-19 Vaccine (3 - Booster) 04/17/2020   INFLUENZA VACCINE  03/13/2021    There are no preventive care reminders to display for this patient.  Lab Results  Component Value Date   TSH 1.64 12/09/2020   Lab Results  Component Value Date   WBC 4.3 12/09/2020   HGB 14.1 12/09/2020   HCT 41.8 12/09/2020   MCV 94.6 12/09/2020   PLT 237 12/09/2020   Lab Results  Component Value Date   NA 140 12/09/2020   K 3.9 12/09/2020   CO2 29 12/09/2020   GLUCOSE 88 12/09/2020   BUN 7 12/09/2020   CREATININE 0.70 12/09/2020   BILITOT 1.2 12/09/2020   ALKPHOS 61 03/04/2018   AST 19 12/09/2020   ALT 13 12/09/2020   PROT 7.0 12/09/2020   ALBUMIN 4.3 10/18/2016   CALCIUM 9.4 12/09/2020   Lab Results  Component Value Date   CHOL 190 12/09/2020   Lab Results  Component Value Date   HDL 97 12/09/2020   Lab Results  Component Value Date   LDLCALC 79 12/09/2020   Lab Results  Component Value Date   TRIG 62 12/09/2020   Lab Results  Component Value Date   CHOLHDL 2.0 12/09/2020   Lab Results  Component Value Date   HGBA1C 5.2 03/04/2018      Assessment & Plan:   Problem List Items Addressed This Visit       Cardiovascular and Mediastinum   HYPERTENSION, BENIGN SYSTEMIC    Blood pressure is at goal.       Other Visit Diagnoses     Dry nose    -  Primary   Relevant Medications   mupirocin ointment (BACTROBAN) 2 %      Dry nose-discussed using nasal saline moisturizer byAyr.  Hopefully should be able to find that over-the-counter.  Also have her use mupirocin ointment at night for 10 days to moisturize the areas.  Also recommend a trial of cool-mist humidifier.  If not improving over the next several weeks then please let me know.  Meds ordered this encounter  Medications   mupirocin ointment (BACTROBAN) 2 %    Sig: Apply to inside of each nares daily for 10 days.     Dispense:  30 g  Refill:  0    Follow-up: No follow-ups on file.    Nani Gasser, MD

## 2021-03-23 ENCOUNTER — Encounter: Payer: Self-pay | Admitting: Family Medicine

## 2021-04-02 ENCOUNTER — Encounter: Payer: Self-pay | Admitting: Family Medicine

## 2021-04-02 DIAGNOSIS — U071 COVID-19: Secondary | ICD-10-CM | POA: Diagnosis not present

## 2021-04-24 DIAGNOSIS — L821 Other seborrheic keratosis: Secondary | ICD-10-CM | POA: Diagnosis not present

## 2021-04-24 DIAGNOSIS — D485 Neoplasm of uncertain behavior of skin: Secondary | ICD-10-CM | POA: Diagnosis not present

## 2021-04-24 DIAGNOSIS — D225 Melanocytic nevi of trunk: Secondary | ICD-10-CM | POA: Diagnosis not present

## 2021-05-06 ENCOUNTER — Other Ambulatory Visit: Payer: Self-pay | Admitting: Family Medicine

## 2021-08-25 ENCOUNTER — Emergency Department
Admission: RE | Admit: 2021-08-25 | Discharge: 2021-08-25 | Disposition: A | Discharge status: Home / Self Care | Payer: BC Managed Care – PPO | Source: Ambulatory Visit

## 2021-08-25 ENCOUNTER — Telehealth: Payer: Self-pay | Admitting: Emergency Medicine

## 2021-08-25 ENCOUNTER — Other Ambulatory Visit: Payer: Self-pay

## 2021-08-25 VITALS — BP 137/88 | HR 89 | Temp 98.5°F | Resp 15 | Ht 66.0 in | Wt 118.0 lb

## 2021-08-25 DIAGNOSIS — L03213 Periorbital cellulitis: Secondary | ICD-10-CM | POA: Diagnosis not present

## 2021-08-25 MED ORDER — CEFDINIR 300 MG PO CAPS: 300.0000 mg | ORAL_CAPSULE | Freq: Two times a day (BID) | ORAL | 0 refills | Status: AC

## 2021-08-25 NOTE — Telephone Encounter (Signed)
Pt call back regarding medication. Pt was asking about the possibility of adding a steroid eye gtt that has worked in the past. RN spoke with provider who saw patient  earlier today. Pt to continue with antibiotic as prescribed. Provider had offered an oral steroid earlier to help decrease the swelling - pt refused at that time. RN explained that a the steroid eye gtts would need to be prescribed by her Opthalmologist due to the need to check the pressure in her eye No other questions at this time. Stacy verbalized an understanding that she would follow up with her eye surgeon if the swelling did not improve.

## 2021-08-25 NOTE — Discharge Instructions (Addendum)
Advised patient to take medication as directed with food to completion.  Encouraged patient to increase daily water intake while taking this medication.  Advised patient if symptoms worsen and or unresolved please follow-up with ophthalmologist/optometrist for further evaluation.

## 2021-08-25 NOTE — ED Triage Notes (Signed)
Left eye pain since yesterday at work  Left eye feels gritty & dry at the same time  Redness noted to inside of left bottom lid Pt noted swelling below left eye below birthmark at work today - not there when she woke up  Does not wear contacts Treatments at home for blepharitis - no changes in eye scrubs or gtts

## 2021-08-25 NOTE — ED Provider Notes (Signed)
[facility]    CSN: [id number] Arrival date & time: 08/25/21  1038      History   Chief Complaint Chief Complaint  Patient presents with   Eye Pain    Left     HPI Patricia Barajas is a 61 y.o. female.   HPI 61 year old female presents with left eye pain since yesterday.  Reports leaving work due to left eye feeling gritty and swollen at the same time.  Patient reports redness of subconjunctival area of the left lower lid.  Patient is noted to having swelling of left eye below birthmark.  Patient reports previously treating at home for left blepharitis using daily eye scrub or Pataday drops for eye dryness.  Past Medical History:  Diagnosis Date   Perimenopausal     Patient Active Problem List   Diagnosis Date Noted   Elevated MCV 06/17/2020   Menopausal disorder 04/08/2012   HEMATURIA UNSPECIFIED 11/29/2008   RAYNAUD'S DISEASE 08/20/2006   HYPERTENSION, BENIGN SYSTEMIC 05/21/2006    Past Surgical History:  Procedure Laterality Date   ADENOIDECTOMY     TUBAL LIGATION      OB History   No obstetric history on file.      Home Medications    Prior to Admission medications   Medication Sig Start Date End Date Taking? Authorizing Provider  cefdinir (OMNICEF) 300 MG capsule Take 1 capsule (300 mg total) by mouth 2 (two) times daily for 7 days. 08/25/21 09/01/21 Yes Eliezer Lofts, FNP  amLODipine (NORVASC) 5 MG tablet TAKE 1 TABLET DAILY 05/08/21   Hali Marry, MD  GARLIC PO Take [id number] mg by mouth daily.    [provider]  Misc Natural Products (LUTEIN 20 PO) Take 1 capsule by mouth daily.    [provider]  mupirocin ointment (BACTROBAN) 2 % Apply to inside of each nares daily for 10 days. Patient not taking: Reported on 08/25/2021 03/17/21   Hali Marry, MD  Specialty Vitamins Products (MAGNESIUM, AMINO ACID CHELATE,) 133 MG tablet Take 1 tablet by mouth 2 (two) times daily.    [provider]    Family  History Family History  Problem Relation Age of Onset   Hypertension Mother    Stroke Father    Cancer Maternal Grandfather        brain    Social History Social History   Tobacco Use   Smoking status: Never    Passive exposure: Never   Smokeless tobacco: Never  Vaping Use   Vaping Use: Never used  Substance Use Topics   Alcohol use: No   Drug use: No     Allergies   Penicillins, Sulfa antibiotics, and Erythromycin base   Review of Systems Review of Systems  Eyes:        Left preorbital swelling x1 day    Physical Exam Triage Vital Signs ED Triage Vitals  Enc Vitals Group     BP 08/25/21 1054 137/88     Pulse Rate 08/25/21 1054 89     Resp 08/25/21 1054 15     Temp 08/25/21 1054 98.5 F (36.9 C)     Temp Source 08/25/21 1054 Oral     SpO2 08/25/21 1054 99 %     Weight 08/25/21 1101 118 lb (53.5 kg)     Height 08/25/21 1101 5\' 6"  (1.676 m)     Head Circumference --      Peak Flow --      Pain Score 08/25/21  1100 3     Pain Loc --      Pain Edu? --      Excl. in [place]? --    No data found.  Updated Vital Signs BP 137/88 (BP Location: Left Arm)    Pulse 89    Temp 98.5 F (36.9 C) (Oral)    Resp 15    Ht 5\' 6"  (1.676 m)    Wt 118 lb (53.5 kg)    SpO2 99%    BMI 19.05 kg/m    Physical Exam Vitals and nursing note reviewed.  Constitutional:      General: She is not in acute distress.    Appearance: Normal appearance. She is normal weight.  HENT:     Head: Normocephalic and atraumatic.     Mouth/Throat:     Mouth: Mucous membranes are moist.     Pharynx: Oropharynx is clear.  Eyes:     Extraocular Movements: Extraocular movements intact.     Conjunctiva/sclera: Conjunctivae normal.     Pupils: Pupils are equal, round, and reactive to light.     Comments: Left-sided preseptal area: Nonerythematous, with mild soft tissue swelling noted  Cardiovascular:     Rate and Rhythm: Normal rate and regular rhythm.     Pulses: Normal pulses.     Heart sounds:  Normal heart sounds.  Pulmonary:     Effort: Pulmonary effort is normal.     Breath sounds: Normal breath sounds.  Musculoskeletal:     Cervical back: Normal range of motion and neck supple. No tenderness.  Lymphadenopathy:     Cervical: No cervical adenopathy.  Skin:    General: Skin is warm and dry.  Neurological:     General: No focal deficit present.     Mental Status: She is alert and oriented to person, place, and time.     UC Treatments / Results  Labs (all labs ordered are listed, but only abnormal results are displayed) Labs Reviewed - No data to display  EKG   Radiology No results found.  Procedures Procedures (including critical care time)  Medications Ordered in UC Medications - No data to display  Initial Impression / Assessment and Plan / UC Course  I have reviewed the triage vital signs and the nursing notes.  Pertinent labs & imaging results that were available during my care of the patient were reviewed by me and considered in my medical decision making (see chart for details).     MDM: 1.  Periorbital cellulitis of left eye-Rx'd Cefdinir. Advised patient to take medication as directed with food to completion.  Encouraged patient to increase daily water intake while taking this medication.  Advised patient if symptoms worsen and or unresolved please follow-up with ophthalmologist/optometrist for further evaluation.  Patient discharged home, hemodynamically stable. Final Clinical Impressions(s) / UC Diagnoses   Final diagnoses:  Periorbital cellulitis of left eye     Discharge Instructions      Advised patient to take medication as directed with food to completion.  Encouraged patient to increase daily water intake while taking this medication.  Advised patient if symptoms worsen and or unresolved please follow-up with ophthalmologist/optometrist for further evaluation.     ED Prescriptions     Medication Sig Dispense Auth. Provider    cefdinir (OMNICEF) 300 MG capsule Take 1 capsule (300 mg total) by mouth 2 (two) times daily for 7 days. 14 capsule Eliezer Lofts, FNP      PDMP not reviewed this encounter.  Eliezer Lofts, [place] 08/25/21 1135

## 2021-08-31 DIAGNOSIS — H0015 Chalazion left lower eyelid: Secondary | ICD-10-CM | POA: Diagnosis not present

## 2021-09-30 ENCOUNTER — Other Ambulatory Visit: Payer: Self-pay | Admitting: Family Medicine

## 2021-10-30 DIAGNOSIS — H0015 Chalazion left lower eyelid: Secondary | ICD-10-CM | POA: Diagnosis not present

## 2021-11-07 DIAGNOSIS — Z124 Encounter for screening for malignant neoplasm of cervix: Secondary | ICD-10-CM | POA: Diagnosis not present

## 2021-11-07 DIAGNOSIS — Z01419 Encounter for gynecological examination (general) (routine) without abnormal findings: Secondary | ICD-10-CM | POA: Diagnosis not present

## 2021-11-07 DIAGNOSIS — Z1231 Encounter for screening mammogram for malignant neoplasm of breast: Secondary | ICD-10-CM | POA: Diagnosis not present

## 2021-11-07 DIAGNOSIS — Z681 Body mass index (BMI) 19 or less, adult: Secondary | ICD-10-CM | POA: Diagnosis not present

## 2021-11-07 LAB — RESULTS CONSOLE HPV: CHL HPV: NEGATIVE

## 2021-11-07 LAB — HM MAMMOGRAPHY

## 2021-11-07 LAB — HM PAP SMEAR: HM Pap smear: NEGATIVE

## 2021-11-22 ENCOUNTER — Encounter: Payer: Self-pay | Admitting: Family Medicine

## 2021-11-25 ENCOUNTER — Encounter: Payer: Self-pay | Admitting: Family Medicine

## 2021-12-29 ENCOUNTER — Telehealth: Payer: Self-pay | Admitting: Family Medicine

## 2021-12-29 NOTE — Telephone Encounter (Signed)
Please call patient and see if she still goes to [facility] for her Pap smears.  We need to get her most recent one on file.  It looks like she is due for her 3-year repeat.

## 2021-12-29 NOTE — Telephone Encounter (Signed)
Pt states that she does still have pap smears done with [facility].  Called [place] and requested copy be sent to our office.  Tiajuana Amass, CMA

## 2022-11-08 ENCOUNTER — Telehealth: Payer: Self-pay | Admitting: General Practice

## 2022-11-08 NOTE — Transitions of Care (Post Inpatient/ED Visit) (Signed)
   11/08/2022  Name: Patricia Barajas MRN: [id number] DOB: 06/11/1961  Today's TOC FU Call Status: Today's TOC FU Call Status:: Unsuccessul Call (1st Attempt) Unsuccessful Call (1st Attempt) Date: 11/08/22  Attempted to reach the patient regarding the most recent Inpatient/ED visit.  Follow Up Plan: Additional outreach attempts will be made to reach the patient to complete the Transitions of Care (Post Inpatient/ED visit) call.   Signature Tinnie Gens, RN BSN

## 2022-11-12 NOTE — Transitions of Care (Post Inpatient/ED Visit) (Signed)
   11/12/2022  Name: Patricia Barajas MRN: [id number] DOB: 04-08-1961  Today's TOC FU Call Status: Today's TOC FU Call Status:: Unsuccessful Call (2nd Attempt) Unsuccessful Call (1st Attempt) Date: 11/08/22 Unsuccessful Call (2nd Attempt) Date: 11/12/22  Attempted to reach the patient regarding the most recent Inpatient/ED visit.  Follow Up Plan: Additional outreach attempts will be made to reach the patient to complete the Transitions of Care (Post Inpatient/ED visit) call.   Signature Tinnie Gens, RN BSN

## 2022-11-13 NOTE — Transitions of Care (Post Inpatient/ED Visit) (Signed)
   11/13/2022  Name: Patricia Barajas MRN: [id number] DOB: 10/13/1960  Today's TOC FU Call Status: Today's TOC FU Call Status:: Unsuccessful Call (3rd Attempt) Unsuccessful Call (1st Attempt) Date: 11/08/22 Unsuccessful Call (2nd Attempt) Date: 11/12/22 Unsuccessful Call (3rd Attempt) Date: 11/13/22  Attempted to reach the patient regarding the most recent Inpatient/ED visit.  Follow Up Plan: No further outreach attempts will be made at this time. We have been unable to contact the patient.  Signature Tinnie Gens, RN BSN
